# Patient Record
Sex: Male | Born: 1975 | Race: White | Hispanic: No | Marital: Married | Smoking: Never smoker
Health system: Southern US, Community
[De-identification: ages and names within clinical notes are randomized; demographics above are authoritative.]

## PROBLEM LIST (undated history)

## (undated) DIAGNOSIS — I10 Essential (primary) hypertension: Secondary | ICD-10-CM

## (undated) HISTORY — PX: CHOLECYSTECTOMY: SHX55

---

## 2005-02-10 ENCOUNTER — Ambulatory Visit: Payer: Self-pay | Admitting: Otolaryngology

## 2007-11-13 ENCOUNTER — Ambulatory Visit: Payer: Self-pay

## 2007-11-13 ENCOUNTER — Other Ambulatory Visit: Payer: Self-pay

## 2010-05-11 ENCOUNTER — Ambulatory Visit: Payer: Self-pay | Admitting: Family Medicine

## 2010-06-24 ENCOUNTER — Ambulatory Visit: Payer: Self-pay | Admitting: Gastroenterology

## 2013-01-03 LAB — URINALYSIS, COMPLETE
Bacteria: NONE SEEN
Bilirubin,UR: NEGATIVE
Ketone: NEGATIVE
Nitrite: NEGATIVE
Ph: 5 (ref 4.5–8.0)
Protein: 100
RBC,UR: 5 /HPF (ref 0–5)

## 2013-01-03 LAB — CBC
MCHC: 35.2 g/dL (ref 32.0–36.0)
MCV: 90 fL (ref 80–100)
Platelet: 190 10*3/uL (ref 150–440)
RBC: 5.33 10*6/uL (ref 4.40–5.90)
RDW: 13 % (ref 11.5–14.5)
WBC: 13.5 10*3/uL — ABNORMAL HIGH (ref 3.8–10.6)

## 2013-01-03 LAB — COMPREHENSIVE METABOLIC PANEL
Albumin: 4.4 g/dL (ref 3.4–5.0)
Alkaline Phosphatase: 86 U/L (ref 50–136)
BUN: 20 mg/dL — ABNORMAL HIGH (ref 7–18)
Bilirubin,Total: 1.3 mg/dL — ABNORMAL HIGH (ref 0.2–1.0)
Calcium, Total: 9.1 mg/dL (ref 8.5–10.1)
Co2: 24 mmol/L (ref 21–32)
Creatinine: 0.95 mg/dL (ref 0.60–1.30)
EGFR (African American): >60
Glucose: 115 mg/dL — ABNORMAL HIGH (ref 65–99)
Osmolality: 281 (ref 275–301)
SGOT(AST): 41 U/L — ABNORMAL HIGH (ref 15–37)
Sodium: 139 mmol/L (ref 136–145)
Total Protein: 8.6 g/dL — ABNORMAL HIGH (ref 6.4–8.2)

## 2013-01-04 ENCOUNTER — Observation Stay: Payer: Self-pay | Admitting: Internal Medicine

## 2013-01-04 LAB — CLOSTRIDIUM DIFFICILE BY PCR

## 2013-01-05 ENCOUNTER — Ambulatory Visit: Payer: Self-pay | Admitting: Emergency Medicine

## 2013-07-13 ENCOUNTER — Ambulatory Visit: Payer: Self-pay | Admitting: Emergency Medicine

## 2014-01-27 ENCOUNTER — Ambulatory Visit: Payer: Self-pay | Admitting: Physician Assistant

## 2014-06-11 DIAGNOSIS — E538 Deficiency of other specified B group vitamins: Secondary | ICD-10-CM | POA: Insufficient documentation

## 2014-06-11 DIAGNOSIS — K760 Fatty (change of) liver, not elsewhere classified: Secondary | ICD-10-CM | POA: Insufficient documentation

## 2014-06-11 DIAGNOSIS — K219 Gastro-esophageal reflux disease without esophagitis: Secondary | ICD-10-CM | POA: Insufficient documentation

## 2014-06-11 DIAGNOSIS — I10 Essential (primary) hypertension: Secondary | ICD-10-CM | POA: Insufficient documentation

## 2015-03-28 NOTE — Discharge Summary (Signed)
PATIENT NAME:  Alex Jenkins, Alex Jenkins MR#:  [id number] DATE OF BIRTH:  August 03, 1976  DATE OF ADMISSION:  01/04/2013 DATE OF DISCHARGE:  01/04/2013  PRESENTING COMPLAINT: Nausea and diarrhea.   DISCHARGE DIAGNOSES:  1. Acute gastroenteritis, appears viral, resolved.  2. Dehydration clinically improved.   CONDITION ON DISCHARGE: Fair. Vitals stable. Blood pressure 145/84, the pulse is 82. Clostridium difficile negative. EKG normal sinus rhythm. Comprehensive metabolic panel within normal limits except SGOT of 41, BUN of 20 and bilirubin of 1.3. White count of 13.5, hemoglobin and hematocrit 16.9 and 48.1. Urinalysis negative for UTI. Lipase 159.  HOSPITAL COURSE: The patient is a pleasant a 39 year old gentleman with a history of chronic sinusitis and allergic sinusitis who was admitted with:  1. Acute gastroenteritis. The patient has children apparently with diarrhea who recovered; however, he started having crampy, abdominal pain and started having loose, watery stool. He was very lethargic and drowsy when he came. Clinically dehydrated and started on aggressive IV hydration. The patient did not have further diarrheal stools, a C. diff was negative. Do not have fever. The chemistries were apparently normal except elevated BUN. The patient was started on clear liquid diet. He tolerated it and was advanced to soft diet prior to discharge. He did well and requested to go home since he was feeling better. The hospital stay otherwise remained stable.  2. Clinical dehydration from gastroenteritis, improved with IV fluids. The patient euvolemic with stable blood pressure prior to discharge. Hospital stay remained stable.   CODE STATUS: THE PATIENT REMAINED A FULL CODE.   TIME SPENT: 40 minutes.  ____________________________ Hart Rochester Posey Pronto, MD sap:jm D: 01/05/2013 07:05:43 ET T: 01/05/2013 10:58:59 ET JOB#: [id number]  cc: Kimberleigh Mehan A. Posey Pronto, MD, <Dictator> Fonnie Jarvis. Ilene Qua, MD Ilda Basset MD ELECTRONICALLY  SIGNED 01/12/2013 7:05

## 2015-03-28 NOTE — H&P (Signed)
PATIENT NAME:  Alex Jenkins, Alex Jenkins MR#:  [id number] DATE OF BIRTH:  01-28-1976  DATE OF ADMISSION:  01/04/2013  PRIMARY CARE PHYSICIAN:  Dr. Gaylan Gerold in [facility] in [place].   REFERRING PHYSICIAN:  Dr. Ponciano Ort.   CHIEF COMPLAINT:  Severe diarrhea associated with generalized weakness, sweating.   HISTORY OF PRESENT ILLNESS:  The patient is a 39 year old pleasant Caucasian male with a healthy past medical history apart from a history of chronic sinusitis and allergic rhinitis and history of gastritis.  The patient reports that his children had self-limited gastroenteritis, primarily diarrhea, but usually lasts for several hours and then they recover.  He was doing well until yesterday morning.  When he woke up he had an uneasy feeling in his stomach, then he started to have little bit of cramps, crampy abdominal pain and then started to have diarrhea.  He had total episodes of 10 watery bowel movements without melena and no hematochezia.  He had no fever, but he felt hot and cold, along with that he had some chills.  When symptoms worsened he decided to come to the Emergency Department.  At the time of arrival he has generalized weakness, feels very weak, barely able to even open his eyes and he was sweating. Here at emergency department was treated with intravenous fluids.  He received a total of 3 liters, but he continued to have back and forth diarrhea.  He had another 5 to 6 episodes where he is at the Emergency Department.  Finally, I was asked by the Emergency Department physician to admit the patient for 24 hours with IV hydration.  The patient had mild nausea, if any.  No vomiting.   REVIEW OF SYSTEMS:  CONSTITUTIONAL:  Denies any fever, but has some chills and sweating and fatigue.  EYES:  No blurring of vision.  No double vision.  EARS, NOSE, THROAT:  No hearing impairment.  No sore throat.  No dysphagia.  CARDIOVASCULAR:  No chest pain.  No shortness of breath.  No syncope.   RESPIRATORY:  No shortness of breath.  No cough.  No chest pain.  GASTROINTESTINAL:  Mild cramps in the abdomen.  No vomiting, but he has frequent diarrhea bowel movements.  No melena or hematochezia.  GENITOURINARY:  No dysuria.  No frequency of urination.  MUSCULOSKELETAL:  No joint pain or swelling.  No muscular pain or swelling.  INTEGUMENTARY:  No skin rash.  No ulcers.  NEUROLOGY:  No focal weakness.  No seizure activity.  No headache.  PSYCHIATRY:  No anxiety.  No depression.  ENDOCRINE:  No polyuria or polydipsia.  No heat or cold intolerance.  HEMATOLOGY:  No easy bruisability.  No lymph node enlargement.   PAST MEDICAL HISTORY:  Gastritis.  He had upper endoscopy in 2011 showing gastritis and he is maintained on Protonix.  Allergic rhinitis and history of chronic sinusitis.   PAST SURGICAL HISTORY:  Sinus surgery x 2.  History of cholecystectomy at age of 35 secondary to gallbladder stones.   FAMILY HISTORY:  His father has hypertension.  His brother also has hypertension.  His mother is alive and she is healthy.  She has colon polyps.   SOCIAL HABITS:  Nonsmoker.  No history of alcohol or drug abuse.   SOCIAL HISTORY:  He is married, living with his wife and he works at [employer]:  Protonix 40 mg a day and he takes allergy shots weekly.  These are targeting grass and pollen  antigens.   ALLERGIES:  No known drug allergies.   PHYSICAL EXAMINATION: VITAL SIGNS:  Blood pressure 150/75, respiratory rate 20, pulse 103, temperature 99, oxygen saturation 98%.  GENERAL APPEARANCE:  A young male lying in bed.  He looks exhausted.  HEAD AND NECK:  No pallor.  No icterus.  No cyanosis.  EARS, NOSE, THROAT:  Ear examination revealed normal hearing.  No discharge.  No lesions.  Nasal mucosa was normal, no ulcers, no discharge.  Oropharyngeal area was normal.  No oral thrush.  The tongue was coated with brownish material.  It appears that he took a sip of coffee  earlier.  No exudates.  EYES:  Revealed normal eyelids and conjunctivae.  Pupils about 5 mm, equal, round and reactive to light.  NECK:  Is supple.  Trachea at midline.  No thyromegaly.  No cervical lymphadenopathy.  No masses.  HEART:  Revealed normal S1, S2.  No S3, S4.  No murmur.  No gallop.  No carotid bruits.  RESPIRATORY:  Revealed normal breathing pattern without use of accessory muscles.  No rales.  No wheezing.  ABDOMEN:  Soft without tenderness.  No hepatosplenomegaly.  No masses.  No hernias.  SKIN:  Revealed no ulcers.  No subcutaneous nodules.  MUSCULOSKELETAL:  No joint swelling.  No clubbing.  NEUROLOGIC:  Cranial nerves II-XII are intact.  No focal motor deficit.  PSYCHIATRIC:  The patient is alert and oriented x 3.  Mood and affect were normal.   LABORATORY FINDINGS:  EKG showed normal sinus rhythm at rate of 90 per minute.  Incomplete right bundle branch block.  Nonspecific T-wave inversion in lead III, otherwise unremarkable EKG.  Serum glucose 115, BUN 20, creatinine 0.9, sodium 139, potassium 4.5, total protein 8.6, albumin 4.4, bilirubin 1.3, alkaline phosphatase 86, AST 41, ALT 52.  CBC showed white count of 13,000, hemoglobin 16, hematocrit 48, platelet count 190.  Urinalysis was unremarkable.   ASSESSMENT: 1.  Acute gastroenteritis.  2.  Dehydration.  3.  Generalized weakness.   PLAN:  We will admit the patient for observation.  Continue IV hydration.  Lomotil 1 mg q. 6 hours as needed for diarrhea.   TIME SPENT IN EVALUATING THIS PATIENT:  Took more than 40 minutes.     ____________________________ Clovis Pu. Lenore Manner, MD amd:ea D: 01/04/2013 01:49:37 ET T: 01/04/2013 03:36:30 ET JOB#: [id number]  cc: Clovis Pu. Lenore Manner, MD, <Dictator> Ellin Saba MD ELECTRONICALLY SIGNED 01/12/2013 6:35

## 2015-05-26 ENCOUNTER — Other Ambulatory Visit: Payer: Self-pay | Admitting: Family Medicine

## 2015-05-26 DIAGNOSIS — R1011 Right upper quadrant pain: Secondary | ICD-10-CM

## 2015-05-27 ENCOUNTER — Ambulatory Visit
Admission: RE | Admit: 2015-05-27 | Discharge: 2015-05-27 | Disposition: A | Discharge status: Home / Self Care | Payer: BLUE CROSS/BLUE SHIELD | Source: Ambulatory Visit | Attending: Family Medicine | Admitting: Family Medicine

## 2015-05-27 DIAGNOSIS — R1011 Right upper quadrant pain: Secondary | ICD-10-CM | POA: Diagnosis not present

## 2017-07-14 ENCOUNTER — Encounter: Payer: Self-pay | Admitting: Emergency Medicine

## 2017-07-14 ENCOUNTER — Emergency Department: Payer: BLUE CROSS/BLUE SHIELD

## 2017-07-14 ENCOUNTER — Emergency Department
Admission: EM | Admit: 2017-07-14 | Discharge: 2017-07-14 | Disposition: A | Discharge status: Home / Self Care | Payer: BLUE CROSS/BLUE SHIELD | Attending: Student in an Organized Health Care Education/Training Program | Admitting: Student in an Organized Health Care Education/Training Program

## 2017-07-14 DIAGNOSIS — M25531 Pain in right wrist: Secondary | ICD-10-CM | POA: Diagnosis present

## 2017-07-14 LAB — BASIC METABOLIC PANEL
ANION GAP: 6 (ref 5–15)
BUN: 15 mg/dL (ref 6–20)
CALCIUM: 9.9 mg/dL (ref 8.9–10.3)
CO2: 26 mmol/L (ref 22–32)
CREATININE: 0.83 mg/dL (ref 0.61–1.24)
Chloride: 107 mmol/L (ref 101–111)
GFR calc Af Amer: >60 mL/min (ref >60)
GFR calc non Af Amer: >60 mL/min (ref >60)
GLUCOSE: 103 mg/dL — AB (ref 65–99)
Potassium: 4 mmol/L (ref 3.5–5.1)
Sodium: 139 mmol/L (ref 135–145)

## 2017-07-14 LAB — CBC WITH DIFFERENTIAL/PLATELET
BASOS ABS: 0.1 10*3/uL (ref 0–0.1)
BASOS PCT: 1 %
EOS PCT: 1 %
Eosinophils Absolute: 0.1 10*3/uL (ref 0–0.7)
HCT: 40.4 % (ref 40.0–52.0)
Hemoglobin: 14.5 g/dL (ref 13.0–18.0)
Lymphocytes Relative: 20 %
Lymphs Abs: 2.2 10*3/uL (ref 1.0–3.6)
MCH: 32.2 pg (ref 26.0–34.0)
MCHC: 35.8 g/dL (ref 32.0–36.0)
MCV: 89.9 fL (ref 80.0–100.0)
Monocytes Absolute: 0.7 10*3/uL (ref 0.2–1.0)
Monocytes Relative: 6 %
Neutro Abs: 8.2 10*3/uL — ABNORMAL HIGH (ref 1.4–6.5)
Neutrophils Relative %: 72 %
PLATELETS: 207 10*3/uL (ref 150–440)
RBC: 4.49 MIL/uL (ref 4.40–5.90)
RDW: 13.2 % (ref 11.5–14.5)
WBC: 11.2 10*3/uL — AB (ref 3.8–10.6)

## 2017-07-14 MED ORDER — KETOROLAC TROMETHAMINE 30 MG/ML IJ SOLN
15.0000 mg | Freq: Once | INTRAMUSCULAR | Status: AC
  Administered 2017-07-14: 15 mg via INTRAVENOUS

## 2017-07-14 MED ORDER — MORPHINE SULFATE (PF) 4 MG/ML IV SOLN
4.0000 mg | INTRAVENOUS | Status: DC | PRN
  Filled 2017-07-14: qty 1

## 2017-07-14 MED ORDER — OXYCODONE-ACETAMINOPHEN 5-325 MG PO TABS
1.0000 | ORAL_TABLET | Freq: Once | ORAL | Status: AC
  Administered 2017-07-14: 1 via ORAL

## 2017-07-14 MED ORDER — MORPHINE SULFATE (PF) 4 MG/ML IV SOLN: 4.0000 mg | INTRAVENOUS | Status: DC | PRN

## 2017-07-14 MED ORDER — LIDOCAINE 5 % EX PTCH
1.0000 | MEDICATED_PATCH | CUTANEOUS | Status: DC
  Administered 2017-07-14: 1 via TRANSDERMAL
  Filled 2017-07-14: qty 1

## 2017-07-14 MED ORDER — OXYCODONE-ACETAMINOPHEN 5-325 MG PO TABS
ORAL_TABLET | ORAL | Status: AC
  Filled 2017-07-14: qty 1

## 2017-07-14 MED ORDER — BUPIVACAINE HCL (PF) 0.5 % IJ SOLN
INTRAMUSCULAR | Status: AC
  Filled 2017-07-14: qty 30

## 2017-07-14 MED ORDER — NAPROXEN 500 MG PO TABS: 500.0000 mg | ORAL_TABLET | Freq: Two times a day (BID) | ORAL | 0 refills | Status: AC

## 2017-07-14 MED ORDER — KETOROLAC TROMETHAMINE 30 MG/ML IJ SOLN
INTRAMUSCULAR | Status: AC
  Administered 2017-07-14: 15 mg via INTRAVENOUS
  Filled 2017-07-14: qty 1

## 2017-07-14 MED ORDER — HYDROCODONE-ACETAMINOPHEN 5-325 MG PO TABS: 1.0000 | ORAL_TABLET | ORAL | 0 refills | Status: DC | PRN

## 2017-07-14 NOTE — ED Triage Notes (Signed)
Pt ambulatory to triage with steady gait, no distress noted. Pt c/o right wrist and forearm pain and swelling, denies injury to area. 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

## 2017-07-14 NOTE — Discharge Instructions (Signed)
Keep arm elevated above heart.  Return immediately for fevers, worsening pain, swelling or inability to move wrist.  Follow up with Ortho clinic.

## 2017-07-14 NOTE — ED Provider Notes (Signed)
[facility] Emergency Department Provider Note    First MD Initiated Contact with Patient 07/14/17 0222     (approximate)  I have reviewed the triage vital signs and the nursing notes.   HISTORY  Chief Complaint Arm Injury    HPI Alex Jenkins is a 41 y.o. male presents to complain of right hand pain and swelling that started tonight. 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 Started having some achiness in his hand while playing piano.  He went home and has persistent worsening in pain No fevers. States he was feeling that his hand was tightening up. He points to the dorsal aspect of his right wrist as the point of most pain. No history of gout.No trauma that he is aware of. No history of arthritis.   History reviewed. No pertinent past medical history. History reviewed. No pertinent family history. History reviewed. No pertinent surgical history. There are no active problems to display for this patient.     Prior to Admission medications   Medication Sig Start Date End Date Taking? Authorizing Provider  HYDROcodone-acetaminophen (NORCO) 5-325 MG tablet Take 1 tablet by mouth every 4 (four) hours as needed for moderate pain. 07/14/17   Merlyn Lot, MD  naproxen (NAPROSYN) 500 MG tablet Take 1 tablet (500 mg total) by mouth 2 (two) times daily with a meal. 07/14/17 07/14/18  Merlyn Lot, MD    Allergies Patient has no known allergies.    Social History Social History  Substance Use Topics  . Smoking status: Never Smoker  . Smokeless tobacco: Never Used  . Alcohol use No    Review of Systems Patient denies headaches, rhinorrhea, blurry vision, numbness, shortness of breath, chest pain, edema, cough, abdominal pain, nausea, vomiting, diarrhea, dysuria, fevers, rashes or hallucinations unless otherwise stated above in HPI. ____________________________________________   PHYSICAL  EXAM:  VITAL SIGNS: Vitals:   07/14/17 0015 07/14/17 0345  BP: (!) 151/89 (!) 145/84  Pulse: 76   Resp: 17 18  Temp: 98 F (36.7 C)     Constitutional: Alert and oriented. Well appearing and in no acute distress. Eyes: Conjunctivae are normal.  Head: Atraumatic. Nose: No congestion/rhinnorhea. Mouth/Throat: Mucous membranes are moist.   Neck: No stridor. Painless ROM.  Cardiovascular: Normal rate, regular rhythm. Grossly normal heart sounds.  Good peripheral circulation. Respiratory: Normal respiratory effort.  No retractions. Lungs CTAB. Gastrointestinal: Soft and nontender. No distention. No abdominal bruits. No CVA tenderness. Genitourinary:  Musculoskeletal: No lower extremity tenderness nor edema.  No joint effusions.  Swelling and point ttp over extonsor component of right radial wrist, no efffusion, erythema or warmth.  Pain with hyperflexion,  Painless passive anf active ROM of wrist. Neurologic:  Normal speech and language. No gross focal neurologic deficits are appreciated. No facial droop Skin:  Skin is warm, dry and intact. No rash noted. Psychiatric: Mood and affect are normal. Speech and behavior are normal.  ____________________________________________   LABS (all labs ordered are listed, but only abnormal results are displayed)  Results for orders placed or performed during the hospital encounter of 07/14/17 (from the past 24 hour(s))  CBC with Differential/Platelet     Status: Abnormal   Collection Time: 07/14/17  2:49 AM  Result Value Ref Range   WBC 11.2 (H) 3.8 - 10.6 K/uL   RBC 4.49 4.40 - 5.90 MIL/uL   Hemoglobin 14.5 13.0 - 18.0 g/dL   HCT 40.4 40.0 - 52.0 %  MCV 89.9 80.0 - 100.0 fL   MCH 32.2 26.0 - 34.0 pg   MCHC 35.8 32.0 - 36.0 g/dL   RDW 13.2 11.5 - 14.5 %   Platelets 207 150 - 440 K/uL   Neutrophils Relative % 72 %   Neutro Abs 8.2 (H) 1.4 - 6.5 K/uL   Lymphocytes Relative 20 %   Lymphs Abs 2.2 1.0 - 3.6 K/uL   Monocytes Relative 6 %    Monocytes Absolute 0.7 0.2 - 1.0 K/uL   Eosinophils Relative 1 %   Eosinophils Absolute 0.1 0 - 0.7 K/uL   Basophils Relative 1 %   Basophils Absolute 0.1 0 - 0.1 K/uL  Basic metabolic panel     Status: Abnormal   Collection Time: 07/14/17  2:49 AM  Result Value Ref Range   Sodium 139 135 - 145 mmol/L   Potassium 4.0 3.5 - 5.1 mmol/L   Chloride 107 101 - 111 mmol/L   CO2 26 22 - 32 mmol/L   Glucose, Bld 103 (H) 65 - 99 mg/dL   BUN 15 6 - 20 mg/dL   Creatinine, Ser 0.83 0.61 - 1.24 mg/dL   Calcium 9.9 8.9 - 10.3 mg/dL   GFR calc non Af Amer >60 >60 mL/min   GFR calc Af Amer >60 >60 mL/min   Anion gap 6 5 - 15   ____________________________________________ ____________________________________________  RADIOLOGY  I personally reviewed all radiographic images ordered to evaluate for the above acute complaints and reviewed radiology reports and findings.  These findings were personally discussed with the patient.  Please see medical record for radiology report.  ____________________________________________   PROCEDURES  Procedure(s) performed:  Procedures Procedure: Trigger point injection. The patient agreed to the procedure without reservation, and acknowledging the risks of infection, nerve damage, damage to internal organs, lack of efficacy, bleeding. The  was prepped with chloraprep and allowed to dry. A 10 cc syringe was loaded with 0.5% Bupivacaine and a 27 ga needle advanced toward the areas of discomfort The plunger was withdrawn before injection. The patient tolerated the procedure well.     Critical Care performed: no ____________________________________________   INITIAL IMPRESSION / ASSESSMENT AND PLAN / ED COURSE  Pertinent labs & imaging results that were available during my care of the patient were reviewed by me and considered in my medical decision making (see chart for details).  DDX: tenosynovitis, arthritis, fracture, tendonitis, cellulitis, nec  fasc  Alex Jenkins is a 41 y.o. who presents to the ED with acute right wrist pain as described above. No evidence of fracture. No evidence of subcutaneous air or gas. No evidence of cellulitis, abscess.  he is not an IV drug abuser. Patient is able to range his wrist therefore do not feel this consistent with septic arthritis. Pain seems to be primarily located along the extensor tendon sheath. There is no significant edema to suggest DVT.  After discussing options for analgesia the patient elected for a trial of local anesthesia. Marcaine was injected as described above with significant relief of his symptom Patient placed in a wrist splint. Discussed signs and symptoms for which she should return immediately to the hospital. Patient given referral for orthopedic follow-up.  Have discussed with the patient and available family all diagnostics and treatments performed thus far and all questions were answered to the best of my ability. The patient demonstrates understanding and agreement with plan.       ____________________________________________   FINAL CLINICAL IMPRESSION(S) / ED DIAGNOSES  Final diagnoses:  Right wrist pain      NEW MEDICATIONS STARTED DURING THIS VISIT:  Discharge Medication List as of 07/14/2017  3:39 AM    START taking these medications   Details  HYDROcodone-acetaminophen (NORCO) 5-325 MG tablet Take 1 tablet by mouth every 4 (four) hours as needed for moderate pain., Starting Thu 07/14/2017, Print    naproxen (NAPROSYN) 500 MG tablet Take 1 tablet (500 mg total) by mouth 2 (two) times daily with a meal., Starting Thu 07/14/2017, Until Fri 07/14/2018, Print         Note:  This document was prepared using Dragon voice recognition software and may include unintentional dictation errors.    Merlyn Lot, MD 07/14/17 [phone]

## 2018-02-23 ENCOUNTER — Other Ambulatory Visit: Payer: Self-pay | Admitting: Gastroenterology

## 2018-02-23 DIAGNOSIS — R1011 Right upper quadrant pain: Secondary | ICD-10-CM

## 2018-02-28 ENCOUNTER — Ambulatory Visit
Admission: RE | Admit: 2018-02-28 | Discharge: 2018-02-28 | Disposition: A | Discharge status: Home / Self Care | Payer: BLUE CROSS/BLUE SHIELD | Source: Ambulatory Visit | Attending: Gastroenterology | Admitting: Gastroenterology

## 2018-02-28 DIAGNOSIS — Z9049 Acquired absence of other specified parts of digestive tract: Secondary | ICD-10-CM | POA: Diagnosis not present

## 2018-02-28 DIAGNOSIS — R1011 Right upper quadrant pain: Secondary | ICD-10-CM | POA: Insufficient documentation

## 2018-02-28 DIAGNOSIS — K769 Liver disease, unspecified: Secondary | ICD-10-CM | POA: Diagnosis not present

## 2018-05-25 DIAGNOSIS — F418 Other specified anxiety disorders: Secondary | ICD-10-CM | POA: Insufficient documentation

## 2018-05-25 DIAGNOSIS — E66813 Obesity, class 3: Secondary | ICD-10-CM | POA: Insufficient documentation

## 2018-07-11 ENCOUNTER — Other Ambulatory Visit: Payer: Self-pay | Admitting: Family Medicine

## 2018-07-11 DIAGNOSIS — R1084 Generalized abdominal pain: Secondary | ICD-10-CM

## 2018-07-11 DIAGNOSIS — R1011 Right upper quadrant pain: Secondary | ICD-10-CM

## 2018-07-17 ENCOUNTER — Ambulatory Visit
Admission: RE | Admit: 2018-07-17 | Discharge: 2018-07-17 | Disposition: A | Discharge status: Home / Self Care | Payer: BLUE CROSS/BLUE SHIELD | Source: Ambulatory Visit | Attending: Family Medicine | Admitting: Family Medicine

## 2018-07-17 DIAGNOSIS — K449 Diaphragmatic hernia without obstruction or gangrene: Secondary | ICD-10-CM | POA: Insufficient documentation

## 2018-07-17 DIAGNOSIS — R1084 Generalized abdominal pain: Secondary | ICD-10-CM | POA: Insufficient documentation

## 2018-07-17 DIAGNOSIS — Z9049 Acquired absence of other specified parts of digestive tract: Secondary | ICD-10-CM | POA: Diagnosis not present

## 2018-07-17 DIAGNOSIS — D3502 Benign neoplasm of left adrenal gland: Secondary | ICD-10-CM | POA: Insufficient documentation

## 2018-07-17 DIAGNOSIS — R1011 Right upper quadrant pain: Secondary | ICD-10-CM

## 2018-07-17 HISTORY — DX: Essential (primary) hypertension: I10

## 2018-07-17 MED ORDER — IOPAMIDOL (ISOVUE-300) INJECTION 61%
100.0000 mL | Freq: Once | INTRAVENOUS | Status: AC | PRN
  Administered 2018-07-17: 100 mL via INTRAVENOUS

## 2018-08-31 ENCOUNTER — Other Ambulatory Visit: Payer: Self-pay | Admitting: Student

## 2018-08-31 DIAGNOSIS — M5414 Radiculopathy, thoracic region: Secondary | ICD-10-CM

## 2018-08-31 DIAGNOSIS — M5412 Radiculopathy, cervical region: Secondary | ICD-10-CM

## 2018-09-07 ENCOUNTER — Ambulatory Visit
Admission: RE | Admit: 2018-09-07 | Discharge: 2018-09-07 | Disposition: A | Discharge status: Home / Self Care | Payer: BLUE CROSS/BLUE SHIELD | Source: Ambulatory Visit | Attending: Student | Admitting: Student

## 2018-09-07 DIAGNOSIS — M5412 Radiculopathy, cervical region: Secondary | ICD-10-CM

## 2018-09-07 DIAGNOSIS — M5414 Radiculopathy, thoracic region: Secondary | ICD-10-CM

## 2018-09-13 ENCOUNTER — Ambulatory Visit
Admission: RE | Admit: 2018-09-13 | Discharge: 2018-09-13 | Disposition: A | Discharge status: Home / Self Care | Payer: BLUE CROSS/BLUE SHIELD | Source: Ambulatory Visit | Attending: Student in an Organized Health Care Education/Training Program | Admitting: Student in an Organized Health Care Education/Training Program

## 2018-09-13 ENCOUNTER — Ambulatory Visit (HOSPITAL_BASED_OUTPATIENT_CLINIC_OR_DEPARTMENT_OTHER): Payer: BLUE CROSS/BLUE SHIELD | Admitting: Student in an Organized Health Care Education/Training Program

## 2018-09-13 ENCOUNTER — Other Ambulatory Visit: Payer: Self-pay

## 2018-09-13 ENCOUNTER — Encounter: Payer: Self-pay | Admitting: Student in an Organized Health Care Education/Training Program

## 2018-09-13 VITALS — BP 137/96 | HR 70 | Temp 98.3°F | Resp 16 | Ht 74.5 in | Wt 322.0 lb

## 2018-09-13 DIAGNOSIS — I10 Essential (primary) hypertension: Secondary | ICD-10-CM | POA: Diagnosis not present

## 2018-09-13 DIAGNOSIS — G8929 Other chronic pain: Secondary | ICD-10-CM | POA: Insufficient documentation

## 2018-09-13 DIAGNOSIS — M5414 Radiculopathy, thoracic region: Secondary | ICD-10-CM | POA: Insufficient documentation

## 2018-09-13 DIAGNOSIS — M5412 Radiculopathy, cervical region: Secondary | ICD-10-CM | POA: Insufficient documentation

## 2018-09-13 DIAGNOSIS — M546 Pain in thoracic spine: Secondary | ICD-10-CM | POA: Diagnosis present

## 2018-09-13 DIAGNOSIS — M4802 Spinal stenosis, cervical region: Secondary | ICD-10-CM | POA: Insufficient documentation

## 2018-09-13 DIAGNOSIS — Z79899 Other long term (current) drug therapy: Secondary | ICD-10-CM | POA: Diagnosis not present

## 2018-09-13 DIAGNOSIS — M5114 Intervertebral disc disorders with radiculopathy, thoracic region: Secondary | ICD-10-CM | POA: Diagnosis not present

## 2018-09-13 DIAGNOSIS — M4804 Spinal stenosis, thoracic region: Secondary | ICD-10-CM | POA: Insufficient documentation

## 2018-09-13 DIAGNOSIS — M79641 Pain in right hand: Secondary | ICD-10-CM | POA: Insufficient documentation

## 2018-09-13 DIAGNOSIS — M7989 Other specified soft tissue disorders: Secondary | ICD-10-CM | POA: Insufficient documentation

## 2018-09-13 DIAGNOSIS — M542 Cervicalgia: Secondary | ICD-10-CM | POA: Insufficient documentation

## 2018-09-13 DIAGNOSIS — R109 Unspecified abdominal pain: Secondary | ICD-10-CM | POA: Diagnosis present

## 2018-09-13 DIAGNOSIS — R0781 Pleurodynia: Secondary | ICD-10-CM | POA: Diagnosis not present

## 2018-09-13 MED ORDER — LIDOCAINE HCL 2 % IJ SOLN
INTRAMUSCULAR | Status: AC
  Filled 2018-09-13: qty 20

## 2018-09-13 MED ORDER — SODIUM CHLORIDE 0.9% FLUSH
2.0000 mL | Freq: Once | INTRAVENOUS | Status: AC
  Administered 2018-09-13: 10 mL

## 2018-09-13 MED ORDER — SODIUM CHLORIDE 0.9 % IJ SOLN
INTRAMUSCULAR | Status: AC
  Filled 2018-09-13: qty 10

## 2018-09-13 MED ORDER — IOPAMIDOL (ISOVUE-M 200) INJECTION 41%
10.0000 mL | Freq: Once | INTRAMUSCULAR | Status: AC
  Administered 2018-09-13: 10 mL via EPIDURAL

## 2018-09-13 MED ORDER — IOPAMIDOL (ISOVUE-M 200) INJECTION 41%
INTRAMUSCULAR | Status: AC
  Filled 2018-09-13: qty 10

## 2018-09-13 MED ORDER — ROPIVACAINE HCL 2 MG/ML IJ SOLN
INTRAMUSCULAR | Status: AC
  Filled 2018-09-13: qty 10

## 2018-09-13 MED ORDER — LIDOCAINE HCL 2 % IJ SOLN
10.0000 mL | Freq: Once | INTRAMUSCULAR | Status: AC
  Administered 2018-09-13: 400 mg

## 2018-09-13 MED ORDER — ROPIVACAINE HCL 2 MG/ML IJ SOLN
2.0000 mL | Freq: Once | INTRAMUSCULAR | Status: AC
  Administered 2018-09-13: 10 mL via EPIDURAL

## 2018-09-13 MED ORDER — DEXAMETHASONE SODIUM PHOSPHATE 10 MG/ML IJ SOLN
INTRAMUSCULAR | Status: AC
  Filled 2018-09-13: qty 1

## 2018-09-13 MED ORDER — DEXAMETHASONE SODIUM PHOSPHATE 10 MG/ML IJ SOLN
10.0000 mg | Freq: Once | INTRAMUSCULAR | Status: AC
  Administered 2018-09-13: 10 mg

## 2018-09-13 NOTE — Progress Notes (Signed)
Safety precautions to be maintained throughout the outpatient stay will include: orient to surroundings, keep bed in low position, maintain call bell within reach at all times, provide assistance with transfer out of bed and ambulation.  

## 2018-09-13 NOTE — Progress Notes (Signed)
Patient's Name: Alex Jenkins  MRN: [id number]  Referring Provider: Harvest Dark, FNP  DOB: Jun 24, 1976  PCP: Derinda Late, MD  DOS: 09/13/2018  Note by: Gillis Santa, MD  Service setting: Ambulatory outpatient  Specialty: Interventional Pain Management  Patient type: Established  Location: [facility] (AMB) Pain Management Facility  Visit type: Interventional Procedure   Primary Reason for Visit: Interventional Pain Management Treatment. CC: Back Pain (mid); Abdominal Pain (right); and Neck Pain  Procedure:          Anesthesia, Analgesia, Anxiolysis:  Type: Therapeutic Inter-Laminar Thoracic Epidural Block  #1  Region: Posterior Thoracolumbar Level: T12-L1 Laterality: Right-Sided       Type: Local Anesthesia Indication(s): Analgesia         Route: Infiltration ([place]/IM) IV Access: Declined Sedation: Declined  Local Anesthetic: Lidocaine 1-2%  Position: Prone   Indications: 1. Thoracic radiculopathy (R-T11/T12)   2. Thoracic spinal stenosis (T11/T12)   3. Cervical radiculopathy at C6 (LEFT)   4. Foraminal stenosis of cervical region    Pain Score: Pre-procedure: 1 /10 Post-procedure: 1 /10  Pre-op Assessment:  Alex Jenkins is a 42 y.o. (year old), male patient, seen today for interventional treatment. He  has a past surgical history that includes Cholecystectomy. Alex Jenkins has a current medication list which includes the following prescription(s): fluticasone, krill oil, multivitamin, pantoprazole, prednisone, valsartan, vitamin b-12, and hydrocodone-acetaminophen. His primarily concern today is the Back Pain (mid); Abdominal Pain (right); and Neck Pain  Initial Vital Signs:  Pulse/HCG Rate: 70ECG Heart Rate: 68 Temp: 98.3 F (36.8 C) Resp: 16 BP: (!) 141/92 SpO2: 100 %  BMI: Estimated body mass index is 40.79 kg/m as calculated from the following:   Height as of this encounter: 6' 2.5" (1.892 m).   Weight as of this encounter: 322 lb (146.1 kg).  Risk  Assessment: Allergies: Reviewed. He has No Known Allergies.  Allergy Precautions: None required Coagulopathies: Reviewed. None identified.  Blood-thinner therapy: None at this time Active Infection(s): Reviewed. None identified. Alex Jenkins is afebrile  Site Confirmation: Alex Jenkins was asked to confirm the procedure and laterality before marking the site Procedure checklist: Completed Consent: Before the procedure and under the influence of no sedative(s), amnesic(s), or anxiolytics, the patient was informed of the treatment options, risks and possible complications. To fulfill our ethical and legal obligations, as recommended by the American Medical Association's Code of Ethics, I have informed the patient of my clinical impression; the nature and purpose of the treatment or procedure; the risks, benefits, and possible complications of the intervention; the alternatives, including doing nothing; the risk(s) and benefit(s) of the alternative treatment(s) or procedure(s); and the risk(s) and benefit(s) of doing nothing. The patient was provided information about the general risks and possible complications associated with the procedure. These may include, but are not limited to: failure to achieve desired goals, infection, bleeding, organ or nerve damage, allergic reactions, paralysis, and death. In addition, the patient was informed of those risks and complications associated to Spine-related procedures, such as failure to decrease pain; infection (i.e.: Meningitis, epidural or intraspinal abscess); bleeding (i.e.: epidural hematoma, subarachnoid hemorrhage, or any other type of intraspinal or peri-dural bleeding); organ or nerve damage (i.e.: Any type of peripheral nerve, nerve root, or spinal cord injury) with subsequent damage to sensory, motor, and/or autonomic systems, resulting in permanent pain, numbness, and/or weakness of one or several areas of the body; allergic reactions; (i.e.: anaphylactic  reaction); and/or death. Furthermore, the patient was informed of those risks  and complications associated with the medications. These include, but are not limited to: allergic reactions (i.e.: anaphylactic or anaphylactoid reaction(s)); adrenal axis suppression; blood sugar elevation that in diabetics may result in ketoacidosis or comma; water retention that in patients with history of congestive heart failure may result in shortness of breath, pulmonary edema, and decompensation with resultant heart failure; weight gain; swelling or edema; medication-induced neural toxicity; particulate matter embolism and blood vessel occlusion with resultant organ, and/or nervous system infarction; and/or aseptic necrosis of one or more joints. Finally, the patient was informed that Medicine is not an exact science; therefore, there is also the possibility of unforeseen or unpredictable risks and/or possible complications that may result in a catastrophic outcome. The patient indicated having understood very clearly. We have given the patient no guarantees and we have made no promises. Enough time was given to the patient to ask questions, all of which were answered to the patient's satisfaction. Alex Jenkins has indicated that he wanted to continue with the procedure. Attestation: I, the ordering provider, attest that I have discussed with the patient the benefits, risks, side-effects, alternatives, likelihood of achieving goals, and potential problems during recovery for the procedure that I have provided informed consent. Date  Time: 09/13/2018  8:35 AM  Pre-Procedure Preparation:  Monitoring: As per clinic protocol. Respiration, ETCO2, SpO2, BP, heart rate and rhythm monitor placed and checked for adequate function Safety Precautions: Patient was assessed for positional comfort and pressure points before starting the procedure. Time-out: I initiated and conducted the "Time-out" before starting the procedure, as per  protocol. The patient was asked to participate by confirming the accuracy of the "Time Out" information. Verification of the correct person, site, and procedure were performed and confirmed by me, the nursing staff, and the patient. "Time-out" conducted as per Joint Commission's Universal Protocol (UP.01.01.01). Time: 0925  Description of Procedure:          Target Area: For Epidural Steroid injection(s), the target area is the  interlaminar space, initially targeting the lower border of the superior vertebral body lamina. Approach: Interlaminar approach. Area Prepped: Entire Posterior Thoracolumbar Region Prepping solution: ChloraPrep (2% chlorhexidine gluconate and 70% isopropyl alcohol) Safety Precautions: Aspiration looking for blood return was conducted prior to all injections. At no point did we inject any substances, as a needle was being advanced. No attempts were made at seeking any paresthesias. Safe injection practices and needle disposal techniques used. Medications properly checked for expiration dates. SDV (single dose vial) medications used. Description of the Procedure: Protocol guidelines were followed. The patient was placed in position over the fluoroscopy table. The target area was identified and the area prepped in the usual manner. Skin & deeper tissues infiltrated with local anesthetic. Appropriate amount of time allowed to pass for local anesthetics to take effect. The procedure needles were then advanced to the target area. The inferior aspect of the superior lamina was contacted and the needle walked caudad, until the lamina was cleared. The epidural space was identified using "loss-of-resistance technique" with 0.9% PF-NSS (2-28mL), in a low friction 10cc LOR glass syringe. Proper needle placement was secured. Negative aspiration confirmed. Solution injected in intermittent fashion, asking for systemic symptoms every 0.5 cc of injectate. The needles were then removed and the area  cleansed, making sure to leave some of the prepping solution behind to take advantage of its long term bactericidal properties. Vitals:   09/13/18 0928 09/13/18 0933 09/13/18 0938 09/13/18 0947  BP: 136/90 131/78 135/81 (!) 137/96  Pulse:      Resp: 14 19 15 16   Temp:      TempSrc:      SpO2: 100% 100% 100% 100%  Weight:      Height:        Start Time: 0925 hrs. End Time: 0941 hrs. Materials:  Needle(s) Type: Epidural needle Gauge: 17G Length: 3.5-in Medication(s): Please see orders for medications and dosing details. 5 cc solution consisting of 2 cc of 0.2% ropivacaine, 2 cc of preservative-free saline, 1 cc of Decadron 10 mg/cc. Imaging Guidance (Spinal):          Type of Imaging Technique: Fluoroscopy Guidance (Spinal) Indication(s): Assistance in needle guidance and placement for procedures requiring needle placement in or near specific anatomical locations not easily accessible without such assistance. Exposure Time: Please see nurses notes. Contrast: Before injecting any contrast, we confirmed that the patient did not have an allergy to iodine, shellfish, or radiological contrast. Once satisfactory needle placement was completed at the desired level, radiological contrast was injected. Contrast injected under live fluoroscopy. No contrast complications. See chart for type and volume of contrast used. Fluoroscopic Guidance: I was personally present during the use of fluoroscopy. "Tunnel Vision Technique" used to obtain the best possible view of the target area. Parallax error corrected before commencing the procedure. "Direction-depth-direction" technique used to introduce the needle under continuous pulsed fluoroscopy. Once target was reached, antero-posterior, oblique, and lateral fluoroscopic projection used confirm needle placement in all planes. Images permanently stored in EMR. Interpretation: I personally interpreted the imaging intraoperatively. Adequate needle placement  confirmed in multiple planes. Appropriate spread of contrast into desired area was observed. No evidence of afferent or efferent intravascular uptake. No intrathecal or subarachnoid spread observed. Permanent images saved into the patient's record.  Antibiotic Prophylaxis:   Anti-infectives (From admission, onward)   None     Indication(s): None identified  Post-operative Assessment:  Post-procedure Vital Signs:  Pulse/HCG Rate: 7073 Temp: 98.3 F (36.8 C) Resp: 16 BP: (!) 137/96 SpO2: 100 %  EBL: None  Complications: No immediate post-treatment complications observed by team, or reported by patient.  Note: The patient tolerated the entire procedure well. A repeat set of vitals were taken after the procedure and the patient was kept under observation following institutional policy, for this type of procedure. Post-procedural neurological assessment was performed, showing return to baseline, prior to discharge. The patient was provided with post-procedure discharge instructions, including a section on how to identify potential problems. Should any problems arise concerning this procedure, the patient was given instructions to immediately contact us, at any time, without hesitation. In any case, we plan to contact the patient by telephone for a follow-up status report regarding this interventional procedure.  Comments:  No additional relevant information.  Plan of Care    Imaging Orders     DG C-Arm 1-60 Min-No Report Procedure Orders    No procedure(s) ordered today    Medications ordered for procedure: Meds ordered this encounter  Medications  . iopamidol (ISOVUE-M) 41 % intrathecal injection 10 mL  . ropivacaine (PF) 2 mg/mL (0.2%) (NAROPIN) injection 2 mL  . sodium chloride flush (NS) 0.9 % injection 2 mL  . lidocaine (XYLOCAINE) 2 % (with pres) injection 200 mg  . dexamethasone (DECADRON) injection 10 mg   Medications administered: We administered iopamidol,  ropivacaine (PF) 2 mg/mL (0.2%), sodium chloride flush, lidocaine, and dexamethasone.  See the medical record for exact dosing, route, and time of administration.  Disposition: Discharge home  Discharge Date & Time: 09/13/2018; 0950 hrs.   Physician-requested Follow-up: Return in about 3 weeks (around 10/04/2018) for Post Procedure Evaluation.  Future Appointments  Date Time Provider [place]  10/03/2018  2:30 PM Gillis Santa, MD [facility] None   Primary Care Physician: Derinda Late, MD Location: [facility] Note by: Gillis Santa, MD Date: 09/13/2018; Time: 11:31 AM  Disclaimer:  Medicine is not an exact science. The only guarantee in medicine is that nothing is guaranteed. It is important to note that the decision to proceed with this intervention was based on the information collected from the patient. The Data and conclusions were drawn from the patient's questionnaire, the interview, and the physical examination. Because the information was provided in large part by the patient, it cannot be guaranteed that it has not been purposely or unconsciously manipulated. Every effort has been made to obtain as much relevant data as possible for this evaluation. It is important to note that the conclusions that lead to this procedure are derived in large part from the available data. Always take into account that the treatment will also be dependent on availability of resources and existing treatment guidelines, considered by other Pain Management Practitioners as being common knowledge and practice, at the time of the intervention. For Medico-Legal purposes, it is also important to point out that variation in procedural techniques and pharmacological choices are the acceptable norm. The indications, contraindications, technique, and results of the above procedure should only be interpreted and judged by a Board-Certified Interventional Pain Specialist with  extensive familiarity and expertise in the same exact procedure and technique.

## 2018-09-13 NOTE — Patient Instructions (Signed)

## 2018-09-13 NOTE — Progress Notes (Signed)
Patient's Name: Alex Jenkins  MRN: [id number]  Referring Provider: Harvest Dark, FNP  DOB: [birth year]  PCP: Derinda Late, MD  DOS: 09/13/2018  Note by: Gillis Santa, MD  Service setting: Ambulatory outpatient  Specialty: Interventional Pain Management  Location: [facility] (AMB) Pain Management Facility    Patient type: New patient ("FAST-TRACK" Evaluation)   Warning: This referral option does not include the extensive pharmacological evaluation required for [place] to take over the patient's medication management. The "Fast-Track" system is designed to bypass the new patient referral waiting list, as well as the normal patient evaluation process, in order to provide a patient in distress with a timely pain management intervention. Because the system was not designed to unfairly get a patient into our pain practice ahead of those already waiting, certain restrictions apply. By requesting a "Fast-Track" consult, the referring physician has opted to continue managing the patient's medications in order to get interventional urgent care.  Primary Reason for Visit: Interventional Pain Management Treatment. CC: Back Pain (mid); Abdominal Pain (right); and Neck Pain   Procedure  HPI  Alex Jenkins is a 42 y.o. year old, male patient, who comes today for a  "Fast-Track" new patient evaluation, as requested by Meeler, Sherren Kerns, FNP. The patient has been made aware that this type of referral option is reserved for the Interventional Pain Management portion of our practice and completely excludes the option of medication management. His primarily concern today is the Back Pain (mid); Abdominal Pain (right); and Neck Pain  Pain Assessment: Location: Mid Back Radiating: denies Onset: More than a month ago Duration: Chronic pain Quality: Nagging Severity: 1 /10 (subjective, self-reported pain score)  Note: Reported level is compatible with observation.                         When using our objective Pain  Scale, levels between 6 and 10/10 are said to belong in an emergency room, as it progressively worsens from a 6/10, described as severely limiting, requiring emergency care not usually available at an outpatient pain management facility. At a 6/10 level, communication becomes difficult and requires great effort. Assistance to reach the emergency department may be required. Facial flushing and profuse sweating along with potentially dangerous increases in heart rate and blood pressure will be evident. Effect on ADL: limits daily activities Timing: Intermittent Modifying factors: sitting, prednisone BP: (!) 137/96  HR: 70  Onset and Duration: Gradual and Present longer than 3 months Cause of pain: Unknown Severity: No change since onset 7 at its worst, 0 at its best, currently 1 Timing: Not influenced by the time of the day, During activity or exercise, After activity or exercise and After a period of immobility Aggravating Factors: Lifiting, Prolonged standing, Twisting, Walking and Working Alleviating Factors: Lying down, Resting and Warm showers or baths Associated Problems: Numbness, Tingling and Weakness Quality of Pain: Annoying, Burning, Dull, Nagging and Uncomfortable Previous Examinations or Tests: CT scan, MRI scan, Neurosurgical evaluation and Chiropractic evaluation Previous Treatments: Chiropractic manipulations, Narcotic medications, Relaxation therapy and Strengthening exercises  The patient comes into the clinics today, referred to [place] for a thoracic epidural steroid injection for thoracic radiculopathy secondary to T11-T12 disc bulge affecting the T11 nerve root.  Patient endorses right-sided thoracic back pain that is been going on for the last 10 months.  He describes pain and tingling that radiates anteriorly into his abdomen.  Pain is present every day.  Sitting or laying flat in  a recliner helps out with his symptoms.  Patient  has left cervical foraminal stenosis at C6 with  possible left C6 radiculitis as well which is contributing to his neck pain that radiates into his left upper extremity.  Patient has some numbness in his upper arm and anterior forearm.   Patient is tried chiropractic therapy without any benefit.  Tried Flexeril which she did not like the side effects nor was it effective.  Has tried meloxicam with little relief.  Also status post steroid therapy without any benefit.   Meds   Current Outpatient Medications:  .  fluticasone (FLONASE) 50 MCG/ACT nasal spray, Place into both nostrils daily., Disp: , Rfl:  .  KRILL OIL PO, Take by mouth., Disp: , Rfl:  .  Multiple Vitamin (MULTIVITAMIN) tablet, Take 1 tablet by mouth daily., Disp: , Rfl:  .  pantoprazole (PROTONIX) 40 MG tablet, Take 40 mg by mouth 2 (two) times daily., Disp: , Rfl:  .  predniSONE (DELTASONE) 10 MG tablet, Take 10 mg by mouth daily with breakfast., Disp: , Rfl:  .  valsartan (DIOVAN) 40 MG tablet, Take 40 mg by mouth daily., Disp: , Rfl:  .  vitamin B-12 (CYANOCOBALAMIN) 250 MCG tablet, Take 250 mcg by mouth daily., Disp: , Rfl:  .  HYDROcodone-acetaminophen (NORCO) 5-325 MG tablet, Take 1 tablet by mouth every 4 (four) hours as needed for moderate pain. (Patient not taking: Reported on 09/13/2018), Disp: 6 tablet, Rfl: 0  Imaging Review  Cervical Imaging: Cervical MR wo contrast:  Results for orders placed during the hospital encounter of 09/07/18  MR CERVICAL SPINE WO CONTRAST   Narrative CLINICAL DATA:  42 year old male with onset of mid back and right rib pain 8-10 months ago. New onset of left side neck pain radiating to the left arm with numbness 3-4 weeks ago.  EXAM: MRI CERVICAL SPINE WITHOUT CONTRAST  TECHNIQUE: Multiplanar, multisequence MR imaging of the cervical spine was performed. No intravenous contrast was administered.  COMPARISON:  None.  FINDINGS: Alignment: Preserved cervical lordosis.  No spondylolisthesis.  Vertebrae: No marrow edema or  evidence of acute osseous abnormality. Visualized bone marrow signal is within normal limits.  Cord: Thin linear central lower cervical spinal cord T2 hyperintensity. This is not apparent on the sagittal T1 weighted imaging and is most compatible with mild enlargement of the central spinal canal (normal variant). Otherwise normal spinal cord signal and morphology.  Posterior Fossa, vertebral arteries, paraspinal tissues: Cervicomedullary junction is within normal limits. Negative visible posterior fossa. There is a right cerebellar developmental venous anomaly suspected (normal variant). Preserved major vascular flow voids in the neck.  Patchy area of mild left erector spinae muscle edema at the C5-C6 level (series 8, image 13 and series 9, image 22), likely related to the neural impingement there described below. Otherwise negative paraspinal soft tissues.  Negative visible left lung apex.  Disc levels:  C2-C3:  Negative.  C3-C4: Mild foraminal disc bulging and endplate spurring. Mild to moderate bilateral C4 foraminal stenosis.  C4-C5: Minimal posterior disc bulging. Mild foraminal endplate spurring primarily on the right. Mild right C5 foraminal stenosis.  C5-C6: Bulky left foraminal disc extrusion (series 7 image 10, series 9, images 20 and 21). Severe left C6 foraminal stenosis. Disc material also effaces the left lateral recess, but there is no significant spinal stenosis. The right foramen remains normal.  C6-C7: Suspicion of a left foraminal annular fissure of the disc on series 9, image 24. Minimal disc bulging. No stenosis.  C7-T1:  Mild facet hypertrophy.  No stenosis.  IMPRESSION: 1. Bulky leftward disc extrusion at C5-C6 mostly affecting the left neural foramen. Severe foraminal stenosis, query left C6 radiculitis. 2. Generally mild cervical spine degeneration elsewhere. Left-sided annular fissure of the C6-C7 disc also suspected.   Electronically  Signed   By: Genevie Ann M.D.   On: 09/07/2018 08:50      Thoracic Imaging: Thoracic MR wo contrast:  Results for orders placed during the hospital encounter of 09/07/18  MR THORACIC SPINE WO CONTRAST   Narrative CLINICAL DATA:  42 year old male with onset of mid back and right rib pain 8-10 months ago. New onset of left side neck pain radiating to the left arm with numbness 3-4 weeks ago.  EXAM: MRI THORACIC SPINE WITHOUT CONTRAST  TECHNIQUE: Multiplanar, multisequence MR imaging of the thoracic spine was performed. No intravenous contrast was administered.  COMPARISON:  Cervical spine MRI today reported separately.  FINDINGS: Limited cervical spine imaging:  Reported separately today.  Thoracic spine segmentation:  Appears normal.  Alignment:  Normal thoracic kyphosis.  No spondylolisthesis.  Vertebrae: Widespread mid and lower thoracic endplate Schmorl's nodes, chronic. No marrow edema or evidence of acute osseous abnormality. Background bone marrow signal appears within normal limits.  Cord: Faint enlargement of the central spinal canal suspected in the lower cervical and upper thoracic spine (series 20, image 3 at T1-T2). Normal spinal cord signal and morphology above the T11 level. No definite cord signal abnormality in the lower thoracic spine. The conus appears to be at T12.  Paraspinal and other soft tissues: Negative visible chest and upper abdominal viscera.  Disc levels:  T1-T2: Negative.  T2-T3: Minimal central disc bulging.  No stenosis.  T3-T4: Negative.  T4-T5: Mild right T4 foraminal stenosis appears related to facet hypertrophy.  T5-T6: Mild facet hypertrophy. Borderline to mild left T5 foraminal stenosis.  T6-T7: Mild facet hypertrophy. Borderline to mild bilateral T6 foraminal stenosis.  T7-T8: Mild facet and ligament flavum hypertrophy.  No stenosis.  T8-T9: Mild facet and ligament flavum hypertrophy. Mild bilateral T8 foraminal  stenosis.  T9-T10: Mild facet hypertrophy. Borderline to mild bilateral T9 foraminal stenosis.  T10-T11: Mild facet and ligament flavum hypertrophy. Mild to moderate bilateral T10 foraminal stenosis.  T11-T12: Broad-based posterior disc bulge or protrusion resulting in spinal stenosis with mild spinal cord mass effect (series 19, image 9 and series 20, image 31). Associated mild to moderate T11 foraminal stenosis greater on the right.  T12-L1: Negative.  IMPRESSION: 1. Positive for degenerative lower thoracic spinal stenosis at T11-T12 with mild cord compression related to broad-based disc bulging. No associated spinal cord signal abnormality. Mild to moderate associated T11 foraminal stenosis. 2. Chronic endplate Schmorl's nodes but otherwise minimal thoracic disc degeneration elsewhere. Thoracic facet and ligament flavum hypertrophy resulting in intermittent mild thoracic neural foraminal stenosis.   Electronically Signed   By: Genevie Ann M.D.   On: 09/07/2018 09:08    Hand Imaging: Hand-R DG Complete:  Results for orders placed during the hospital encounter of 07/14/17  DG Hand Complete Right   Narrative CLINICAL DATA:  Acute onset of right hand pain and swelling. Initial encounter.  EXAM: RIGHT HAND - COMPLETE 3+ VIEW  COMPARISON:  None.  FINDINGS: There is no evidence of fracture or dislocation. The joint spaces are preserved. The carpal rows are intact, and demonstrate normal alignment. Negative ulnar variance is noted. The soft tissues are unremarkable in appearance.  IMPRESSION: No evidence of fracture or dislocation.  Electronically Signed   By: Garald Balding M.D.   On: 07/14/2017 00:37    Hand-L DG Complete: No results found for this or any previous visit.  Complexity Note: Imaging results reviewed. Results shared with Alex Jenkins, using Layman's terms.                         ROS  Cardiovascular: High blood pressure Pulmonary or Respiratory:  No reported pulmonary signs or symptoms such as wheezing and difficulty taking a deep full breath (Asthma), difficulty blowing air out (Emphysema), coughing up mucus (Bronchitis), persistent dry cough, or temporary stoppage of breathing during sleep Neurological: No reported neurological signs or symptoms such as seizures, abnormal skin sensations, urinary and/or fecal incontinence, being born with an abnormal open spine and/or a tethered spinal cord Review of Past Neurological Studies: No results found for this or any previous visit. Psychological-Psychiatric: No reported psychological or psychiatric signs or symptoms such as difficulty sleeping, anxiety, depression, delusions or hallucinations (schizophrenial), mood swings (bipolar disorders) or suicidal ideations or attempts Gastrointestinal: Reflux or heatburn Genitourinary: No reported renal or genitourinary signs or symptoms such as difficulty voiding or producing urine, peeing blood, non-functioning kidney, kidney stones, difficulty emptying the bladder, difficulty controlling the flow of urine, or chronic kidney disease Hematological: No reported hematological signs or symptoms such as prolonged bleeding, low or poor functioning platelets, bruising or bleeding easily, hereditary bleeding problems, low energy levels due to low hemoglobin or being anemic Endocrine: No reported endocrine signs or symptoms such as high or low blood sugar, rapid heart rate due to high thyroid levels, obesity or weight gain due to slow thyroid or thyroid disease Rheumatologic: No reported rheumatological signs and symptoms such as fatigue, joint pain, tenderness, swelling, redness, heat, stiffness, decreased range of motion, with or without associated rash Musculoskeletal: Negative for myasthenia gravis, muscular dystrophy, multiple sclerosis or malignant hyperthermia Work History: Working full time  Allergies  Alex Jenkins has No Known Allergies.  Laboratory  Chemistry  Inflammation Markers (CRP: Acute Phase) (ESR: Chronic Phase) No results found for: CRP, ESRSEDRATE, LATICACIDVEN                       Rheumatology Markers No results found for: RF, ANA, LABURIC, URICUR, LYMEIGGIGMAB, LYMEABIGMQN, HLAB27                      Renal Function Markers Lab Results  Component Value Date   BUN 15 07/14/2017   CREATININE 0.83 07/14/2017   GFRAA >60 07/14/2017   GFRNONAA >60 07/14/2017                             Hepatic Function Markers Lab Results  Component Value Date   AST 41 (H) 01/03/2013   ALT 52 01/03/2013   ALBUMIN 4.4 01/03/2013   ALKPHOS 86 01/03/2013   LIPASE 159 01/03/2013                        Electrolytes Lab Results  Component Value Date   NA 139 07/14/2017   K 4.0 07/14/2017   CL 107 07/14/2017   CALCIUM 9.9 07/14/2017                        Neuropathy Markers No results found for: VITAMINB12, FOLATE, HGBA1C, HIV  CNS Tests No results found for: COLORCSF, APPEARCSF, RBCCOUNTCSF, WBCCSF, POLYSCSF, LYMPHSCSF, EOSCSF, PROTEINCSF, GLUCCSF, JCVIRUS, CSFOLI, IGGCSF                      Bone Pathology Markers No results found for: VD25OH, [id number], [id number], WF0932TF5, 25OHVITD1, 25OHVITD2, 25OHVITD3, TESTOFREE, TESTOSTERONE                       Coagulation Parameters Lab Results  Component Value Date   PLT 207 07/14/2017                        Cardiovascular Markers Lab Results  Component Value Date   HGB 14.5 07/14/2017   HCT 40.4 07/14/2017                         CA Markers No results found for: CEA, CA125, LABCA2                      Note: Lab results reviewed.  [place]  Drug: Alex Jenkins  reports that he does not use drugs. Alcohol:  reports that he does not drink alcohol. Tobacco:  reports that he has never smoked. He has never used smokeless tobacco. Medical:  has a past medical history of Hypertension. Family: family history is not on file.  Past Surgical History:   Procedure Laterality Date  . CHOLECYSTECTOMY     Active Ambulatory Problems    Diagnosis Date Noted  . Thoracic radiculopathy (R-T11/T12) 09/13/2018  . Thoracic spinal stenosis (T11/T12) 09/13/2018  . Cervical radiculopathy at C6 (LEFT) 09/13/2018  . Foraminal stenosis of cervical region 09/13/2018   Resolved Ambulatory Problems    Diagnosis Date Noted  . No Resolved Ambulatory Problems   Past Medical History:  Diagnosis Date  . Hypertension    Constitutional Exam  General appearance: Well nourished, well developed, and well hydrated. In no apparent acute distress Vitals:   09/13/18 0928 09/13/18 0933 09/13/18 0938 09/13/18 0947  BP: 136/90 131/78 135/81 (!) 137/96  Pulse:      Resp: 14 19 15 16   Temp:      TempSrc:      SpO2: 100% 100% 100% 100%  Weight:      Height:       BMI Assessment: Estimated body mass index is 40.79 kg/m as calculated from the following:   Height as of this encounter: 6' 2.5" (1.892 m).   Weight as of this encounter: 322 lb (146.1 kg).  BMI interpretation table: BMI level Category Range association with higher incidence of chronic pain  <18 kg/m2 Underweight   18.5-24.9 kg/m2 Ideal body weight   25-29.9 kg/m2 Overweight Increased incidence by 20%  30-34.9 kg/m2 Obese (Class I) Increased incidence by 68%  35-39.9 kg/m2 Severe obesity (Class II) Increased incidence by 136%  >40 kg/m2 Extreme obesity (Class III) Increased incidence by 254%   Patient's current BMI Ideal Body weight  Body mass index is 40.79 kg/m. Ideal body weight: 83.4 kg (183 lb 12.1 oz) Adjusted ideal body weight: 108.4 kg (239 lb 0.8 oz)   BMI Readings from Last 4 Encounters:  09/13/18 40.79 kg/m  07/14/17 37.88 kg/m   Wt Readings from Last 4 Encounters:  09/13/18 (!) 322 lb (146.1 kg)  07/14/17 295 lb (133.8 kg)  Psych/Mental status: Alert, oriented x 3 (person, place, & time)       Eyes: PERLA Respiratory:  No evidence of acute respiratory distress  Cervical  Spine Area Exam  Skin & Axial Inspection: No masses, redness, edema, swelling, or associated skin lesions Alignment: Symmetrical Functional ROM: Decreased ROM, to the left Stability: No instability detected Muscle Tone/Strength: Functionally intact. No obvious neuro-muscular anomalies detected. Sensory (Neurological): Dermatomal pain pattern Palpation: No palpable anomalies             Positive Spurling's on the left Upper Extremity (UE) Exam    Side: Right upper extremity  Side: Left upper extremity  Skin & Extremity Inspection: Skin color, temperature, and hair growth are WNL. No peripheral edema or cyanosis. No masses, redness, swelling, asymmetry, or associated skin lesions. No contractures.  Skin & Extremity Inspection: Skin color, temperature, and hair growth are WNL. No peripheral edema or cyanosis. No masses, redness, swelling, asymmetry, or associated skin lesions. No contractures.  Functional ROM: Unrestricted ROM          Functional ROM: Unrestricted ROM          Muscle Tone/Strength: Functionally intact. No obvious neuro-muscular anomalies detected.  Muscle Tone/Strength: Functionally intact. No obvious neuro-muscular anomalies detected.  Sensory (Neurological): Unimpaired          Sensory (Neurological): Dermatomal pain pattern          Palpation: No palpable anomalies              Palpation: No palpable anomalies              Provocative Test(s):  Phalen's test: deferred Tinel's test: deferred Apley's scratch test (touch opposite shoulder):  Action 1 (Across chest): deferred Action 2 (Overhead): deferred Action 3 (LB reach): deferred   Provocative Test(s):  Phalen's test: deferred Tinel's test: deferred Apley's scratch test (touch opposite shoulder):  Action 1 (Across chest): deferred Action 2 (Overhead): deferred Action 3 (LB reach): deferred    Thoracic Spine Area Exam  Skin & Axial Inspection: No masses, redness, or swelling Alignment: Symmetrical Functional ROM:  Decreased ROM Stability: No instability detected Muscle Tone/Strength: Functionally intact. No obvious neuro-muscular anomalies detected. Sensory (Neurological): Dermatomal pain pattern radiates to anterior abdomen consistent with T11-T12 radiculopathy. Muscle strength & Tone: No palpable anomalies   Lumbar Spine Area Exam  Skin & Axial Inspection: No masses, redness, or swelling Alignment: Symmetrical Functional ROM: Decreased ROM       Stability: No instability detected Muscle Tone/Strength: Functionally intact. No obvious neuro-muscular anomalies detected. Sensory (Neurological): Dermatomal pain pattern Palpation: No palpable anomalies       Provocative Tests: Hyperextension/rotation test: (+) bilaterally for facet joint pain. Lumbar quadrant test (Kemp's test): (+) due to pain. Lateral bending test: (+) due to pain. Patrick's Maneuver: deferred today                   FABER test: deferred today                   S-I anterior distraction/compression test: deferred today         S-I lateral compression test: deferred today         S-I Thigh-thrust test: deferred today         S-I Gaenslen's test: deferred today          Gait & Posture Assessment  Ambulation: Unassisted Gait: Relatively normal for age and body habitus Posture: WNL   Lower Extremity Exam    Side: Right lower extremity  Side: Left lower extremity  Stability: No instability observed  Stability: No instability observed          Skin & Extremity Inspection: Skin color, temperature, and hair growth are WNL. No peripheral edema or cyanosis. No masses, redness, swelling, asymmetry, or associated skin lesions. No contractures.  Skin & Extremity Inspection: Skin color, temperature, and hair growth are WNL. No peripheral edema or cyanosis. No masses, redness, swelling, asymmetry, or associated skin lesions. No contractures.  Functional ROM: Unrestricted ROM                  Functional ROM: Unrestricted ROM                   Muscle Tone/Strength: Functionally intact. No obvious neuro-muscular anomalies detected.  Muscle Tone/Strength: Functionally intact. No obvious neuro-muscular anomalies detected.  Sensory (Neurological): Unimpaired  Sensory (Neurological): Unimpaired  Palpation: No palpable anomalies  Palpation: No palpable anomalies    Procedure  42 year old male who presents with a chief complaint of right thoracic back pain that radiates into his right abdomen related to thoracic radiculopathy from disc herniation at T11-T12 resulting in T11 and T12 dermatome pain.  Patient also has left neck pain that radiates to his left arm along with paresthesias consistent with left C6 radiculopathy.  Patient states that thoracic back pain is very debilitating.  We discussed thoracic epidural steroid injection at T11-T12 for his thoracic radicular symptoms.  Risks and benefits of this procedure were discussed.  Patient like to proceed.  See procedure note.

## 2018-09-14 ENCOUNTER — Telehealth: Payer: Self-pay | Admitting: *Deleted

## 2018-09-14 NOTE — Telephone Encounter (Signed)
No problems post procedure. 

## 2018-10-03 ENCOUNTER — Ambulatory Visit
Payer: BLUE CROSS/BLUE SHIELD | Attending: Student in an Organized Health Care Education/Training Program | Admitting: Student in an Organized Health Care Education/Training Program

## 2018-10-03 ENCOUNTER — Encounter: Payer: Self-pay | Admitting: Student in an Organized Health Care Education/Training Program

## 2018-10-03 ENCOUNTER — Other Ambulatory Visit: Payer: Self-pay

## 2018-10-03 VITALS — BP 135/85 | HR 73 | Temp 98.5°F | Resp 16 | Ht 74.5 in | Wt 325.0 lb

## 2018-10-03 DIAGNOSIS — M4802 Spinal stenosis, cervical region: Secondary | ICD-10-CM | POA: Diagnosis not present

## 2018-10-03 DIAGNOSIS — M4804 Spinal stenosis, thoracic region: Secondary | ICD-10-CM

## 2018-10-03 DIAGNOSIS — M5414 Radiculopathy, thoracic region: Secondary | ICD-10-CM | POA: Diagnosis not present

## 2018-10-03 DIAGNOSIS — M5412 Radiculopathy, cervical region: Secondary | ICD-10-CM | POA: Diagnosis not present

## 2018-10-03 MED ORDER — METHYLPREDNISOLONE 4 MG PO TBPK: ORAL_TABLET | ORAL | 0 refills | Status: AC

## 2018-10-03 NOTE — Progress Notes (Signed)
Patient's Name: Alex Jenkins  MRN: [id number]  Referring Provider: Derinda Late, MD  DOB: 1976-09-23  PCP: Derinda Late, MD  DOS: 10/03/2018  Note by: Gillis Santa, MD  Service setting: Ambulatory outpatient  Specialty: Interventional Pain Management  Location: [facility] (AMB) Pain Management Facility    Patient type: Established   Primary Reason(s) for Visit: Encounter for post-procedure evaluation of chronic illness with mild to moderate exacerbation CC: Back Pain (right mid); Abdominal Pain (right); and Arm Pain (left; numbness)  HPI  Alex Jenkins is a 42 y.o. year old, male patient, who comes today for a post-procedure evaluation. He has Thoracic radiculopathy (R-T11/T12); Thoracic spinal stenosis (T11/T12); Cervical radiculopathy at C6 (LEFT); and Foraminal stenosis of cervical region on their problem list. His primarily concern today is the Back Pain (right mid); Abdominal Pain (right); and Arm Pain (left; numbness)  Pain Assessment: Location: Right, Mid Back Radiating: denies Onset: More than a month ago Duration: Chronic pain Quality: ("sometimes it burns") Severity: 5 /10 (subjective, self-reported pain score)  Note: Reported level is compatible with observation.                         When using our objective Pain Scale, levels between 6 and 10/10 are said to belong in an emergency room, as it progressively worsens from a 6/10, described as severely limiting, requiring emergency care not usually available at an outpatient pain management facility. At a 6/10 level, communication becomes difficult and requires great effort. Assistance to reach the emergency department may be required. Facial flushing and profuse sweating along with potentially dangerous increases in heart rate and blood pressure will be evident. Effect on ADL: limits daily activities Timing: Intermittent Modifying factors: sitting, prednisone BP: 135/85  HR: 73  Alex Jenkins comes in today for post-procedure  evaluation.  Further details on both, my assessment(s), as well as the proposed treatment plan, please see below.  Post-Procedure Assessment  09/13/2018 Procedure: Right T12-L1 ESI Pre-procedure pain score:  1/10 Post-procedure pain score: 1/10         Influential Factors: BMI: 41.17 kg/m Intra-procedural challenges: None observed.         Assessment challenges: None detected.              Reported side-effects: None.        Post-procedural adverse reactions or complications: None reported         Sedation: Please see nurses note. When no sedatives are used, the analgesic levels obtained are directly associated to the effectiveness of the local anesthetics. However, when sedation is provided, the level of analgesia obtained during the initial 1 hour following the intervention, is believed to be the result of a combination of factors. These factors may include, but are not limited to: 1. The effectiveness of the local anesthetics used. 2. The effects of the analgesic(s) and/or anxiolytic(s) used. 3. The degree of discomfort experienced by the patient at the time of the procedure. 4. The patients ability and reliability in recalling and recording the events. 5. The presence and influence of possible secondary gains and/or psychosocial factors. Reported result: Relief experienced during the 1st hour after the procedure: 100 % (Ultra-Short Term Relief)            Interpretative annotation: Clinically appropriate result. Analgesia during this period is likely to be Local Anesthetic and/or IV Sedative (Analgesic/Anxiolytic) related.          Effects of local anesthetic: The analgesic effects attained  during this period are directly associated to the localized infiltration of local anesthetics and therefore cary significant diagnostic value as to the etiological location, or anatomical origin, of the pain. Expected duration of relief is directly dependent on the pharmacodynamics of the local  anesthetic used. Long-acting (4-6 hours) anesthetics used.  Reported result: Relief during the next 4 to 6 hour after the procedure: 0 % (Short-Term Relief)            Interpretative annotation: Unexpected result. Analgesia during this period is likely to be Local Anesthetic-related.          Long-term benefit: Defined as the period of time past the expected duration of local anesthetics (1 hour for short-acting and 4-6 hours for long-acting). With the possible exception of prolonged sympathetic blockade from the local anesthetics, benefits during this period are typically attributed to, or associated with, other factors such as analgesic sensory neuropraxia, antiinflammatory effects, or beneficial biochemical changes provided by agents other than the local anesthetics.  Reported result: Extended relief following procedure: 0 % (Long-Term Relief)            Interpretative annotation: Expected clinical result. No benefit. No permanent benefit expected. Pain appears to be refractory to this treatment modality.          Current benefits: Defined as reported results that persistent at this point in time.   Analgesia: 0 %            Function: No benefit ROM: No benefit Interpretative annotation: No benefit. Therapeutic failure. Results would argue against repeating therapy.          Interpretation: Results would suggest failure of therapy in achieving desired goal(s).                  Plan:  Please see "Plan of Care" for details.                Laboratory Chemistry  Inflammation Markers (CRP: Acute Phase) (ESR: Chronic Phase) No results found for: CRP, ESRSEDRATE, LATICACIDVEN                       Rheumatology Markers No results found for: RF, ANA, LABURIC, URICUR, LYMEIGGIGMAB, LYMEABIGMQN, HLAB27                      Renal Function Markers Lab Results  Component Value Date   BUN 15 07/14/2017   CREATININE 0.83 07/14/2017   GFRAA >60 07/14/2017   GFRNONAA >60 07/14/2017                              Hepatic Function Markers Lab Results  Component Value Date   AST 41 (H) 01/03/2013   ALT 52 01/03/2013   ALBUMIN 4.4 01/03/2013   ALKPHOS 86 01/03/2013   LIPASE 159 01/03/2013                        Electrolytes Lab Results  Component Value Date   NA 139 07/14/2017   K 4.0 07/14/2017   CL 107 07/14/2017   CALCIUM 9.9 07/14/2017                        Neuropathy Markers No results found for: VITAMINB12, FOLATE, HGBA1C, HIV  CNS Tests No results found for: COLORCSF, APPEARCSF, RBCCOUNTCSF, WBCCSF, POLYSCSF, LYMPHSCSF, EOSCSF, PROTEINCSF, GLUCCSF, JCVIRUS, CSFOLI, IGGCSF                      Bone Pathology Markers No results found for: VD25OH, [id number], XI5038UE2, CM0349ZP9, 25OHVITD1, 25OHVITD2, 25OHVITD3, TESTOFREE, TESTOSTERONE                       Coagulation Parameters Lab Results  Component Value Date   PLT 207 07/14/2017                        Cardiovascular Markers Lab Results  Component Value Date   HGB 14.5 07/14/2017   HCT 40.4 07/14/2017                         CA Markers No results found for: CEA, CA125, LABCA2                      Note: Lab results reviewed.  Recent Diagnostic Imaging Results  DG C-Arm 1-60 Min-No Report Fluoroscopy was utilized by the requesting physician.  No radiographic  interpretation.   Complexity Note: Imaging results reviewed. Results shared with Alex Jenkins, using Layman's terms.                         Meds   Current Outpatient Medications:  .  fluticasone (FLONASE) 50 MCG/ACT nasal spray, Place into both nostrils daily., Disp: , Rfl:  .  KRILL OIL PO, Take by mouth., Disp: , Rfl:  .  Multiple Vitamin (MULTIVITAMIN) tablet, Take 1 tablet by mouth daily., Disp: , Rfl:  .  pantoprazole (PROTONIX) 40 MG tablet, Take 40 mg by mouth 2 (two) times daily., Disp: , Rfl:  .  valsartan (DIOVAN) 40 MG tablet, Take 40 mg by mouth daily., Disp: , Rfl:  .  vitamin B-12 (CYANOCOBALAMIN) 250 MCG  tablet, Take 250 mcg by mouth daily., Disp: , Rfl:  .  methylPREDNISolone (MEDROL) 4 MG TBPK tablet, Follow package instructions., Disp: 21 tablet, Rfl: 0 .  predniSONE (DELTASONE) 10 MG tablet, Take 10 mg by mouth daily with breakfast., Disp: , Rfl:   ROS  Constitutional: Denies any fever or chills Gastrointestinal: No reported hemesis, hematochezia, vomiting, or acute GI distress Musculoskeletal: Denies any acute onset joint swelling, redness, loss of ROM, or weakness Neurological: No reported episodes of acute onset apraxia, aphasia, dysarthria, agnosia, amnesia, paralysis, loss of coordination, or loss of consciousness  Allergies  Alex Jenkins has No Known Allergies.  [place]  Drug: Alex Jenkins  reports that he does not use drugs. Alcohol:  reports that he does not drink alcohol. Tobacco:  reports that he has never smoked. He has never used smokeless tobacco. Medical:  has a past medical history of Hypertension. Surgical: Alex Jenkins  has a past surgical history that includes Cholecystectomy. Family: family history is not on file.  Constitutional Exam  General appearance: Well nourished, well developed, and well hydrated. In no apparent acute distress Vitals:   10/03/18 1431  BP: 135/85  Pulse: 73  Resp: 16  Temp: 98.5 F (36.9 C)  TempSrc: Oral  SpO2: 100%  Weight: (!) 325 lb (147.4 kg)  Height: 6' 2.5" (1.892 m)   BMI Assessment: Estimated body mass index is 41.17 kg/m as calculated from the following:   Height as of this encounter:  6' 2.5" (1.892 m).   Weight as of this encounter: 325 lb (147.4 kg).  BMI interpretation table: BMI level Category Range association with higher incidence of chronic pain  <18 kg/m2 Underweight   18.5-24.9 kg/m2 Ideal body weight   25-29.9 kg/m2 Overweight Increased incidence by 20%  30-34.9 kg/m2 Obese (Class I) Increased incidence by 68%  35-39.9 kg/m2 Severe obesity (Class II) Increased incidence by 136%  >40 kg/m2 Extreme obesity  (Class III) Increased incidence by 254%   Patient's current BMI Ideal Body weight  Body mass index is 41.17 kg/m. Ideal body weight: 83.4 kg (183 lb 12.1 oz) Adjusted ideal body weight: 109 kg (240 lb 4 oz)   BMI Readings from Last 4 Encounters:  10/03/18 41.17 kg/m  09/13/18 40.79 kg/m  07/14/17 37.88 kg/m   Wt Readings from Last 4 Encounters:  10/03/18 (!) 325 lb (147.4 kg)  09/13/18 (!) 322 lb (146.1 kg)  07/14/17 295 lb (133.8 kg)  Psych/Mental status: Alert, oriented x 3 (person, place, & time)       Eyes: PERLA Respiratory: No evidence of acute respiratory distress  Cervical Spine Area Exam  Skin & Axial Inspection: No masses, redness, edema, swelling, or associated skin lesions Alignment: Symmetrical Functional ROM: Unrestricted ROM      Stability: No instability detected Muscle Tone/Strength: Functionally intact. No obvious neuro-muscular anomalies detected. Sensory (Neurological): Dermatomal pain pattern left forearm and hand Palpation: No palpable anomalies              Upper Extremity (UE) Exam    Side: Right upper extremity  Side: Left upper extremity  Skin & Extremity Inspection: Skin color, temperature, and hair growth are WNL. No peripheral edema or cyanosis. No masses, redness, swelling, asymmetry, or associated skin lesions. No contractures.  Skin & Extremity Inspection: Skin color, temperature, and hair growth are WNL. No peripheral edema or cyanosis. No masses, redness, swelling, asymmetry, or associated skin lesions. No contractures.  Functional ROM: Unrestricted ROM          Functional ROM: Unrestricted ROM          Muscle Tone/Strength: Functionally intact. No obvious neuro-muscular anomalies detected.  Muscle Tone/Strength: Functionally intact. No obvious neuro-muscular anomalies detected.  Sensory (Neurological): Unimpaired          Sensory (Neurological): Dermatomal pain pattern affecting all joints of upper extremity  Palpation: No palpable anomalies               Palpation: No palpable anomalies              Provocative Test(s):  Phalen's test: deferred Tinel's test: deferred Apley's scratch test (touch opposite shoulder):  Action 1 (Across chest): deferred Action 2 (Overhead): deferred Action 3 (LB reach): deferred   Provocative Test(s):  Phalen's test: deferred Tinel's test: deferred Apley's scratch test (touch opposite shoulder):  Action 1 (Across chest): deferred Action 2 (Overhead): deferred Action 3 (LB reach): deferred    Thoracic Spine Area Exam  Skin & Axial Inspection: No masses, redness, or swelling Alignment: Symmetrical Functional ROM: Adequate ROM Stability: No instability detected Muscle Tone/Strength: Functionally intact. No obvious neuro-muscular anomalies detected. Sensory (Neurological): Dermatomal pain pattern Muscle strength & Tone: No palpable anomalies  Lumbar Spine Area Exam  Skin & Axial Inspection: No masses, redness, or swelling Alignment: Symmetrical Functional ROM: Unrestricted ROM       Stability: No instability detected Muscle Tone/Strength: Functionally intact. No obvious neuro-muscular anomalies detected. Sensory (Neurological): Unimpaired Palpation: No palpable anomalies  Provocative Tests: Hyperextension/rotation test: deferred today       Lumbar quadrant test (Kemp's test): deferred today       Lateral bending test: deferred today       Patrick's Maneuver: deferred today                   FABER test: deferred today                   S-I anterior distraction/compression test: deferred today         S-I lateral compression test: deferred today         S-I Thigh-thrust test: deferred today         S-I Gaenslen's test: deferred today          Gait & Posture Assessment  Ambulation: Unassisted Gait: Relatively normal for age and body habitus Posture: WNL   Lower Extremity Exam    Side: Right lower extremity  Side: Left lower extremity  Stability: No instability observed           Stability: No instability observed          Skin & Extremity Inspection: Skin color, temperature, and hair growth are WNL. No peripheral edema or cyanosis. No masses, redness, swelling, asymmetry, or associated skin lesions. No contractures.  Skin & Extremity Inspection: Skin color, temperature, and hair growth are WNL. No peripheral edema or cyanosis. No masses, redness, swelling, asymmetry, or associated skin lesions. No contractures.  Functional ROM: Unrestricted ROM                  Functional ROM: Unrestricted ROM                  Muscle Tone/Strength: Functionally intact. No obvious neuro-muscular anomalies detected.  Muscle Tone/Strength: Functionally intact. No obvious neuro-muscular anomalies detected.  Sensory (Neurological): Unimpaired  Sensory (Neurological): Unimpaired  Palpation: No palpable anomalies  Palpation: No palpable anomalies   Assessment  Primary Diagnosis & Pertinent Problem List: The primary encounter diagnosis was Thoracic radiculopathy (R-T11/T12). Diagnoses of Thoracic spinal stenosis (T11/T12), Cervical radiculopathy at C6 (LEFT), and Foraminal stenosis of cervical region were also pertinent to this visit.  Status Diagnosis  Persistent Persistent Stable 1. Thoracic radiculopathy (R-T11/T12)   2. Thoracic spinal stenosis (T11/T12)   3. Cervical radiculopathy at C6 (LEFT)   4. Foraminal stenosis of cervical region       42 year old male with a history of thoracic radiculopathy most pronounced at T11 and T12 affecting the T11 neuroforamen on the right.  Patient is status post right T12-L1 epidural steroid injection without any significant benefit.  We spent extended period of time reviewing the patient's MRI as well as images from the patient's thoracic epidural steroid injection.  Reviewing fluoroscopy images reveals appropriate contrast spread to cover the T11-T12 nerve roots.  Patient's pain he states occasionally worsens with coughing suggesting discogenic  pathology.  Patient is not interested in repeating thoracic epidural steroid injection.  In regard to the patient's cervical radicular pain, he states that his pain has improved although he still endorsing numbness in his left forearm and hand.  I did offer the patient a left cervical epidural steroid injection but he wants to hold off at this time.  I will prescribe the patient a Medrol Dosepak as below and we will also refer him to physical therapy.  Patient can follow-up with me as needed.  Plan of Care  Pharmacotherapy (Medications Ordered): Meds ordered this encounter  Medications  .  methylPREDNISolone (MEDROL) 4 MG TBPK tablet    Sig: Follow package instructions.    Dispense:  21 tablet    Refill:  0    Do not add to the "Automatic Refill" notification system.   Lab-work, procedure(s), and/or referral(s): Orders Placed This Encounter  Procedures  . Ambulatory referral to Physical Therapy   Time Note: Greater than 50% of the 25 minute(s) of face-to-face time spent with Alex Jenkins, was spent in counseling/coordination of care regarding: the treatment plan, treatment alternatives, the risks and possible complications of proposed treatment, medication side effects, the results, interpretation and significance of  his recent diagnostic interventional treatment(s), realistic expectations and the goals of pain management (increased in functionality).  Provider-requested follow-up: Return if symptoms worsen or fail to improve.  No future appointments.  Primary Care Physician: Derinda Late, MD Location: [facility] Note by: Gillis Santa, M.D Date: 10/03/2018; Time: 3:28 PM  There are no Patient Instructions on file for this visit.

## 2018-10-03 NOTE — Progress Notes (Signed)
Safety precautions to be maintained throughout the outpatient stay will include: orient to surroundings, keep bed in low position, maintain call bell within reach at all times, provide assistance with transfer out of bed and ambulation.  

## 2018-10-10 ENCOUNTER — Encounter: Payer: Self-pay | Admitting: Student in an Organized Health Care Education/Training Program

## 2019-12-08 IMAGING — MR MR THORACIC SPINE W/O CM
4 of 6 series · 18 of 48 positions shown · non-contrast
Comparison: Cervical spine MRI today reported separately.

CLINICAL DATA: 42-year-old male with onset of mid back and right
rib pain 8-10 months ago. New onset of left side neck pain radiating
to the left arm with numbness 3-4 weeks ago.

EXAM:
MRI THORACIC SPINE WITHOUT CONTRAST
TECHNIQUE: Multiplanar, multisequence MR imaging of the thoracic spine was
performed. No intravenous contrast was administered.

[Series 17: T1 · sagittal · 3.0mm · 0.78mm/px · 3 of 17 slices shown]
[im 4/17]
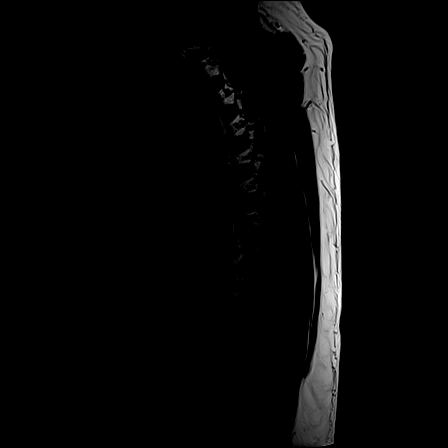
[im 10/17]
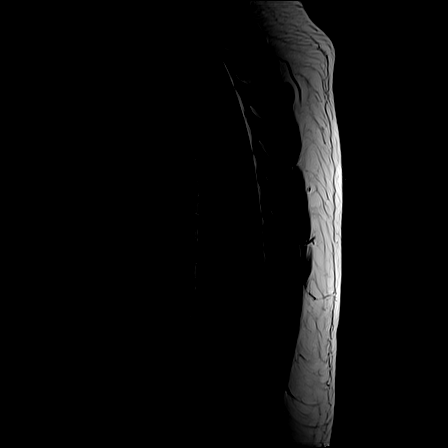
[im 17/17]
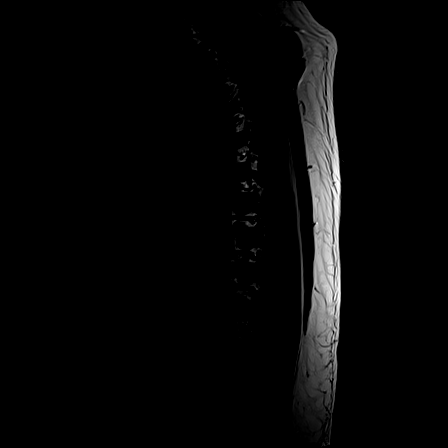

[Series 18: STIR · sagittal · 3.0mm · 0.78mm/px · 3 of 17 slices shown]
[im 4/17]
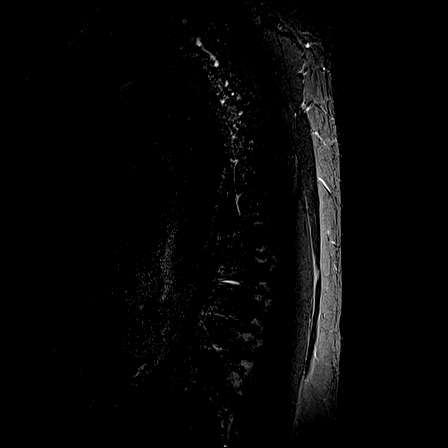
[im 10/17]
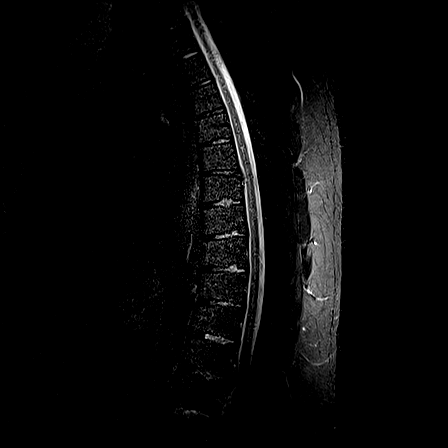
[im 17/17]
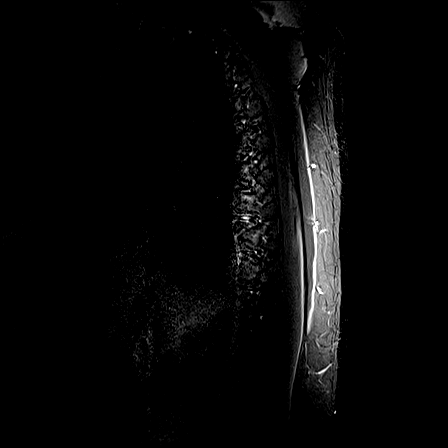

[Series 19: T2 · sagittal · 3.0mm · 0.78mm/px · 6 of 17 slices shown (1 of 2)]
[im 1/17]
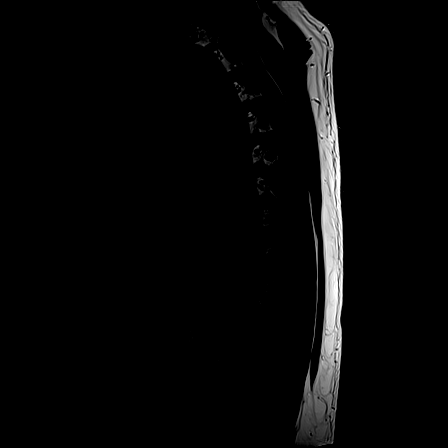
[im 4/17]
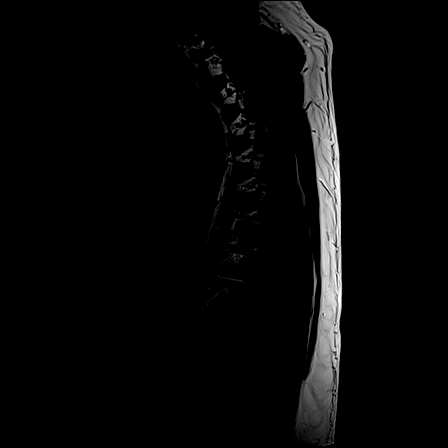
[im 7/17]
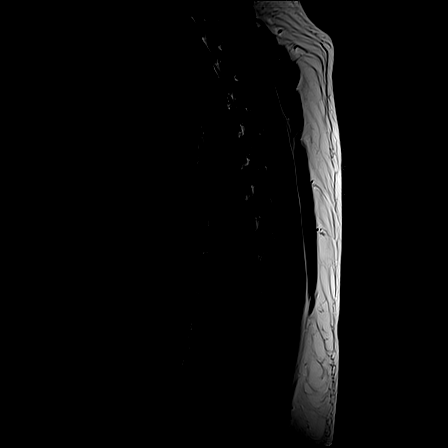
[im 10/17]
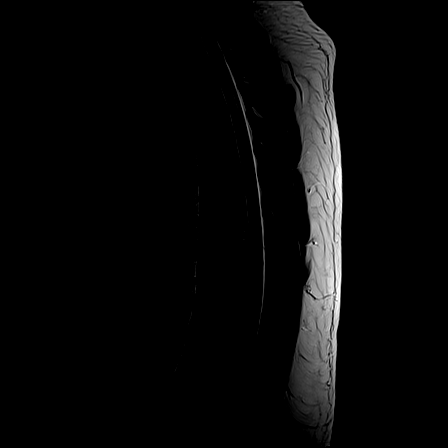
[im 13/17]
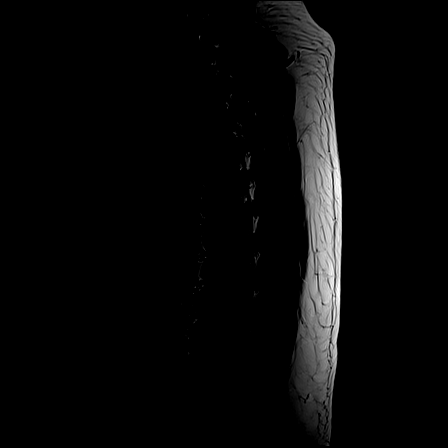
[im 17/17]
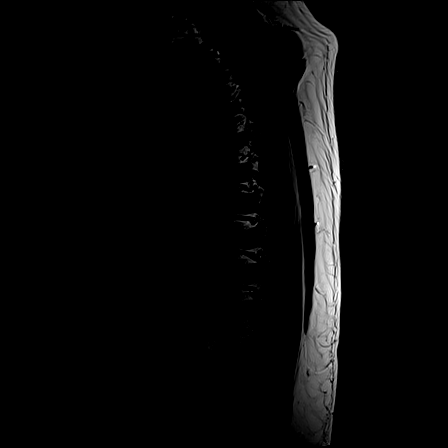

[Series 20: T2 · axial · 4.0mm · 0.28mm/px · z∈[-286,-94]mm · 6 of 33 slices shown (2 of 2)]
[im 1/33]
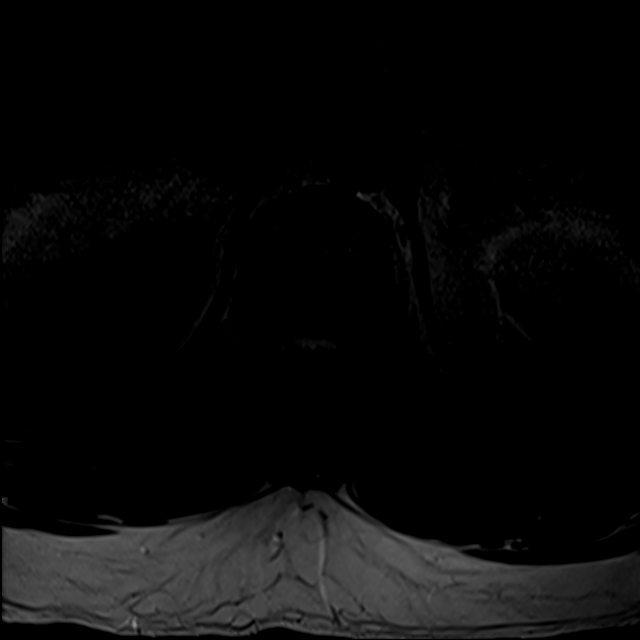
[im 6/33]
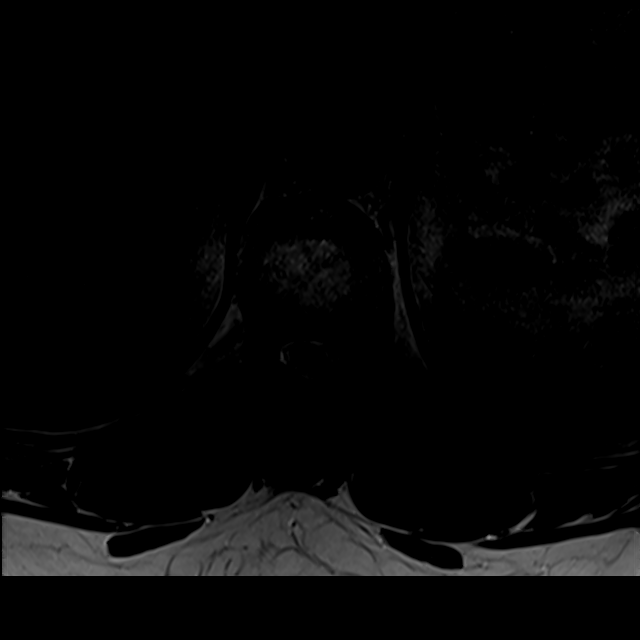
[im 9/33]
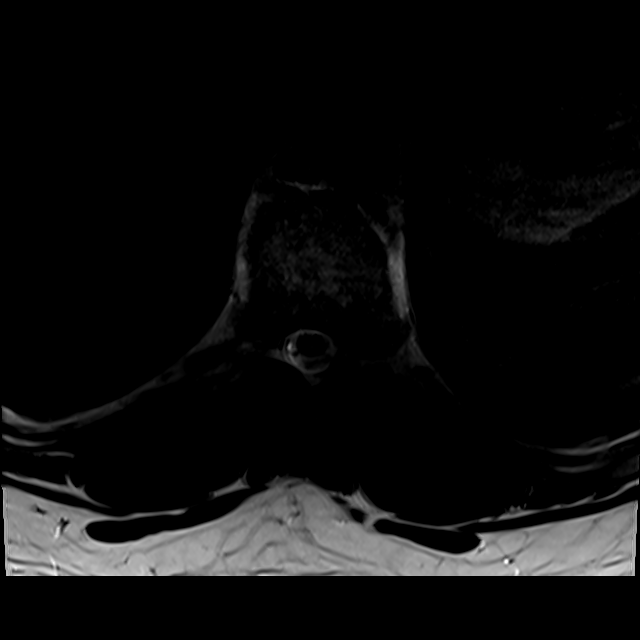
[im 15/33]
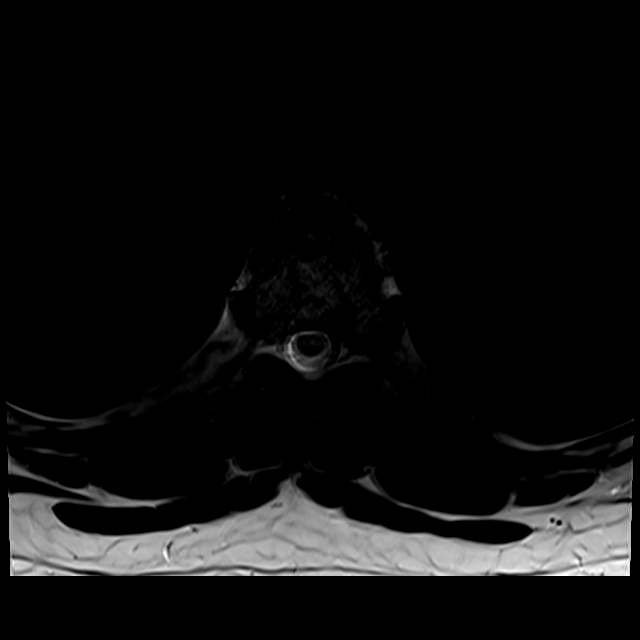
[im 18/33]
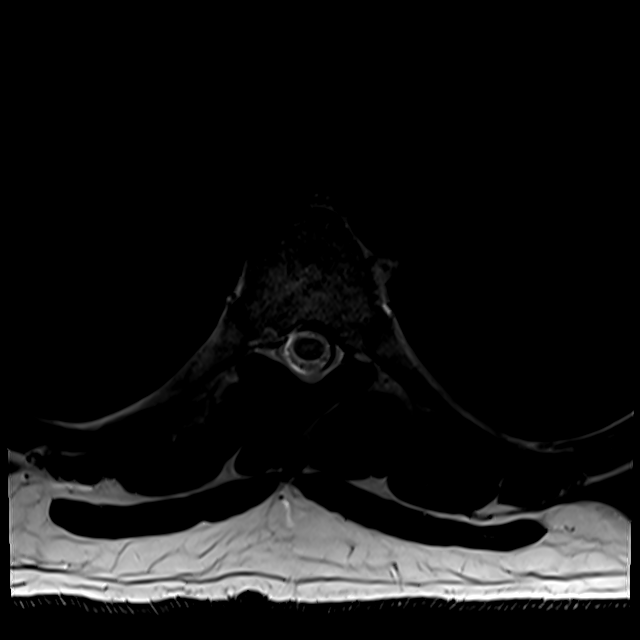
[im 30/33]
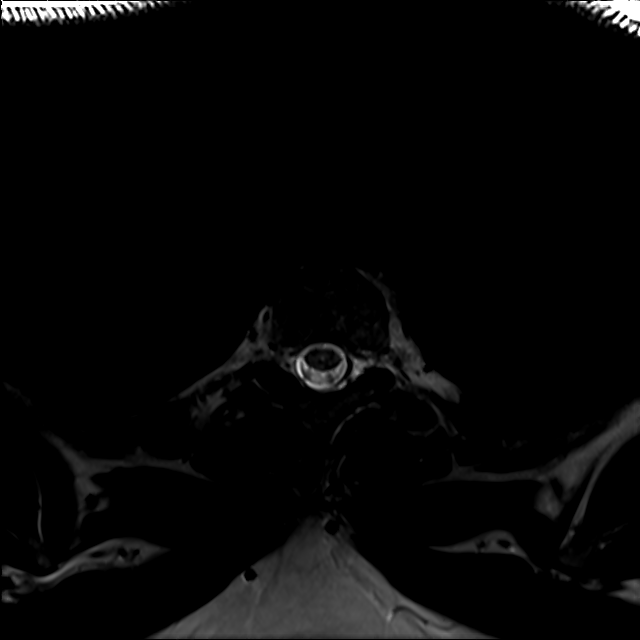

[18 of 48 positions shown; findings below may reference images not displayed]

FINDINGS: Limited cervical spine imaging:  Reported separately today.

Thoracic spine segmentation:  Appears normal.

Alignment:  Normal thoracic kyphosis.  No spondylolisthesis.

Vertebrae: Widespread mid and lower thoracic endplate Schmorl's
nodes, chronic. No marrow edema or evidence of acute osseous
abnormality. Background bone marrow signal appears within normal
limits.

Cord: Faint enlargement of the central spinal canal suspected in the
lower cervical and upper thoracic spine (series 20, image 3 at
T1-T2). Normal spinal cord signal and morphology above the T11
level. No definite cord signal abnormality in the lower thoracic
spine. The conus appears to be at T12.

Paraspinal and other soft tissues: Negative visible chest and upper
abdominal viscera.

Disc levels:

T1-T2: Negative.

T2-T3: Minimal central disc bulging.  No stenosis.

T3-T4: Negative.

T4-T5: Mild right T4 foraminal stenosis appears related to facet
hypertrophy.

T5-T6: Mild facet hypertrophy. Borderline to mild left T5 foraminal
stenosis.

T6-T7: Mild facet hypertrophy. Borderline to mild bilateral T6
foraminal stenosis.

T7-T8: Mild facet and ligament flavum hypertrophy.  No stenosis.

T8-T9: Mild facet and ligament flavum hypertrophy. Mild bilateral T8
foraminal stenosis.

T9-T10: Mild facet hypertrophy. Borderline to mild bilateral T9
foraminal stenosis.

T10-T11: Mild facet and ligament flavum hypertrophy. Mild to
moderate bilateral T10 foraminal stenosis.

T11-T12: Broad-based posterior disc bulge or protrusion resulting in
spinal stenosis with mild spinal cord mass effect (series 19, image
9 and series 20, image 31). Associated mild to moderate T11
foraminal stenosis greater on the right.

T12-L1: Negative.
IMPRESSION: 1. Positive for degenerative lower thoracic spinal stenosis at
T11-T12 with mild cord compression related to broad-based disc
bulging. No associated spinal cord signal abnormality. Mild to
moderate associated T11 foraminal stenosis.
2. Chronic endplate Schmorl's nodes but otherwise minimal thoracic
disc degeneration elsewhere. Thoracic facet and ligament flavum
hypertrophy resulting in intermittent mild thoracic neural foraminal
stenosis.

## 2020-12-16 ENCOUNTER — Other Ambulatory Visit: Payer: Self-pay

## 2020-12-16 ENCOUNTER — Ambulatory Visit: Payer: BC Managed Care – PPO | Admitting: Podiatry

## 2020-12-16 ENCOUNTER — Ambulatory Visit (INDEPENDENT_AMBULATORY_CARE_PROVIDER_SITE_OTHER): Payer: BC Managed Care – PPO

## 2020-12-16 DIAGNOSIS — M2141 Flat foot [pes planus] (acquired), right foot: Secondary | ICD-10-CM

## 2020-12-16 DIAGNOSIS — M2142 Flat foot [pes planus] (acquired), left foot: Secondary | ICD-10-CM

## 2020-12-16 DIAGNOSIS — M778 Other enthesopathies, not elsewhere classified: Secondary | ICD-10-CM | POA: Diagnosis not present

## 2020-12-16 NOTE — Progress Notes (Signed)
   Subjective:  45 y.o. male presenting today as a new patient for evaluation of bilateral foot pain has been going on for several years.  He states that he works up and down on his feet throughout the day and experiences significant foot pain.  He has been seeing a chiropractor to help relieve some of the pain and back pain.  He presents for further treatment evaluation   Past Medical History:  Diagnosis Date  . Hypertension        Objective/Physical Exam General: The patient is alert and oriented x3 in no acute distress.  Dermatology: Skin is warm, dry and supple bilateral lower extremities. Negative for open lesions or macerations.  Vascular: Palpable pedal pulses bilaterally. No edema or erythema noted. Capillary refill within normal limits.  Neurological: Epicritic and protective threshold grossly intact bilaterally.   Musculoskeletal Exam: Range of motion within normal limits to all pedal and ankle joints bilateral. Muscle strength 5/5 in all groups bilateral.  Upon weightbearing there is a medial longitudinal arch collapse bilaterally. Remove foot valgus noted to the bilateral lower extremities with excessive pronation upon mid stance.  Radiographic Exam:  Normal osseous mineralization. Joint spaces preserved. No fracture/dislocation/boney destruction.   Pes planus noted on radiographic exam lateral views. Decreased calcaneal inclination and metatarsal declination angle is noted. Anterior break in the cyma line noted on lateral views. Medial talar head to deviation noted on AP radiograph.   Assessment: 1. pes planus bilateral   Plan of Care:  1. Patient was evaluated. X-Rays reviewed.  2.  Appointment with Pedorthist for custom molded orthotics 3.  Recommend good supportive shoes and not going barefoot 4.  Return to clinic as needed   Edrick Kins, DPM [facility]  Dr. Edrick Kins, DPM    [address], [place] [address]                Office [phone]  Fax [phone]

## 2020-12-17 ENCOUNTER — Ambulatory Visit (INDEPENDENT_AMBULATORY_CARE_PROVIDER_SITE_OTHER): Payer: BC Managed Care – PPO | Admitting: Orthotics

## 2020-12-17 DIAGNOSIS — M2142 Flat foot [pes planus] (acquired), left foot: Secondary | ICD-10-CM

## 2020-12-17 DIAGNOSIS — M778 Other enthesopathies, not elsewhere classified: Secondary | ICD-10-CM

## 2020-12-17 DIAGNOSIS — M2141 Flat foot [pes planus] (acquired), right foot: Secondary | ICD-10-CM

## 2021-01-21 ENCOUNTER — Ambulatory Visit: Payer: BC Managed Care – PPO | Admitting: Orthotics

## 2021-01-21 ENCOUNTER — Other Ambulatory Visit: Payer: Self-pay

## 2021-01-21 DIAGNOSIS — M778 Other enthesopathies, not elsewhere classified: Secondary | ICD-10-CM

## 2021-01-21 DIAGNOSIS — M2141 Flat foot [pes planus] (acquired), right foot: Secondary | ICD-10-CM

## 2021-01-21 NOTE — Progress Notes (Signed)
Patient picked up f/o and was pleased with fit, comfort, and function.  Worked well with footwear.  Told of rbeak in period and how to report any issues.  

## 2021-03-11 ENCOUNTER — Telehealth: Payer: Self-pay | Admitting: Podiatry

## 2021-03-11 NOTE — Telephone Encounter (Signed)
Pt picked up orthotics in february and states that he has almost worn a hole in the forefoot.   I told him that is not normal and to drop them off at the office and I will get them sent back to be redone. He asked if it would be with the same material and I told pt I would ask the company to redo it with something more durable.I told pt it takes a few weeks to get them back.

## 2021-03-12 NOTE — Telephone Encounter (Signed)
Pt presented to office with orthotics and they will be sent to [facility] office for repair by Dr Amalia Hailey tomorrow.

## 2021-03-16 ENCOUNTER — Telehealth: Payer: Self-pay | Admitting: Podiatry

## 2021-03-16 NOTE — Telephone Encounter (Signed)
Talked with Cristie Hem at [facility] lab before calling pt. I wanted to make sure pt was aware we are changing his inserts because the material used is not upholding to pts weight and activities seeing how the toe is almost worn a hole in it in the 2 months patient has had them. Pt is ok with this and I told pt I would call when I get them in.

## 2021-03-27 NOTE — Telephone Encounter (Signed)
Alex Jenkins came by and picked up his orthotics.  He did not come into the office because he said he was sick.  Alex Jenkins delivered the orthotics to him at his car in the parking lot.  He signed the form stating he received the orthotics.

## 2021-06-10 NOTE — Progress Notes (Signed)
Patient in office today and seen by EJ with [facility] for custom orthotic measurements. Patient advised that the office will call when the orthotics are available for pick-up.

## 2021-08-05 ENCOUNTER — Other Ambulatory Visit: Payer: BC Managed Care – PPO

## 2021-08-19 ENCOUNTER — Ambulatory Visit (INDEPENDENT_AMBULATORY_CARE_PROVIDER_SITE_OTHER): Payer: BC Managed Care – PPO

## 2021-08-19 ENCOUNTER — Other Ambulatory Visit: Payer: Self-pay

## 2021-08-19 ENCOUNTER — Other Ambulatory Visit: Payer: BC Managed Care – PPO

## 2021-08-19 DIAGNOSIS — M2142 Flat foot [pes planus] (acquired), left foot: Secondary | ICD-10-CM

## 2021-08-19 DIAGNOSIS — M2141 Flat foot [pes planus] (acquired), right foot: Secondary | ICD-10-CM

## 2021-08-19 NOTE — Progress Notes (Signed)
Patient comes in with pain in left lateral side foot pain from orthotic. He states "it feels like a ball on the side of his foot"   A custom thin lift was placed under orthotic to raise latera side of orthotic up.  Patient states "it feels better already with the lift"  Patient will call if any other trouble arises

## 2021-12-18 ENCOUNTER — Telehealth: Payer: Self-pay | Admitting: Podiatry

## 2021-12-18 NOTE — Telephone Encounter (Signed)
Pt called and left message stating he called his insurance to see if he could get a new pair of orthotics and they said he did qualify for them.  I called pt back and explained that he would need an appt with the doctor for a new rx if we are billing the insurance. He does not need to see the pedorthist if ordering the same ones as last yr. He is scheduled for 1.24.2023 in [place] office.

## 2021-12-29 ENCOUNTER — Other Ambulatory Visit: Payer: Self-pay

## 2021-12-29 ENCOUNTER — Ambulatory Visit: Payer: BC Managed Care – PPO | Admitting: Podiatry

## 2021-12-29 ENCOUNTER — Encounter: Payer: Self-pay | Admitting: Podiatry

## 2021-12-29 DIAGNOSIS — M2142 Flat foot [pes planus] (acquired), left foot: Secondary | ICD-10-CM | POA: Diagnosis not present

## 2021-12-29 DIAGNOSIS — M2141 Flat foot [pes planus] (acquired), right foot: Secondary | ICD-10-CM

## 2021-12-29 NOTE — Progress Notes (Signed)
° °  Subjective:  47 y.o. male presenting today for follow-up evaluation of bilateral flatfoot deformity for several years.  Patient was last seen in the office approximately 1 year ago and orthotics were made for the patient.  He has been in a few different times throughout the year to have them adjusted.  He presents today to have new orthotics molded and made.  He says that overall the orthotics have helped with some of his foot pain.  He presents for further treatment and evaluation  Past Medical History:  Diagnosis Date   Hypertension    Past Surgical History:  Procedure Laterality Date   CHOLECYSTECTOMY     Allergies  Allergen Reactions   Omeprazole Other (See Comments)       Objective/Physical Exam General: The patient is alert and oriented x3 in no acute distress.  Dermatology: Skin is warm, dry and supple bilateral lower extremities. Negative for open lesions or macerations.  Hyperkeratotic callus tissue noted to the plantar aspect of the IPJ bilateral great toes.  The patient puts a lot of pressure here during weightbearing  Vascular: Palpable pedal pulses bilaterally. No edema or erythema noted. Capillary refill within normal limits.  Neurological: Epicritic and protective threshold grossly intact bilaterally.   Musculoskeletal Exam: Range of motion within normal limits to all pedal and ankle joints bilateral. Muscle strength 5/5 in all groups bilateral.  Upon weightbearing there is a medial longitudinal arch collapse bilaterally. Remove foot valgus noted to the bilateral lower extremities with excessive pronation upon mid stance.  Assessment: 1. pes planus bilateral 2.  Symptomatic callus plantar aspect of the great toes bilateral   Plan of Care:  1. Patient was evaluated.  2.  Appointment with Pedorthist for custom molded orthotics 3.  Recommend good supportive shoes and not going barefoot 4.  Return to clinic annually  *Works at [employer] since 1999   Edrick Kins, DPM [facility]  Dr. Edrick Kins, DPM    [address], [place] [address]                Office [phone]  Fax [phone]

## 2022-01-15 ENCOUNTER — Ambulatory Visit (INDEPENDENT_AMBULATORY_CARE_PROVIDER_SITE_OTHER): Payer: BC Managed Care – PPO

## 2022-01-15 ENCOUNTER — Other Ambulatory Visit: Payer: Self-pay

## 2022-01-15 DIAGNOSIS — M2142 Flat foot [pes planus] (acquired), left foot: Secondary | ICD-10-CM | POA: Diagnosis not present

## 2022-01-15 DIAGNOSIS — M2141 Flat foot [pes planus] (acquired), right foot: Secondary | ICD-10-CM

## 2022-01-15 NOTE — Progress Notes (Signed)
SITUATION Reason for Consult: Evaluation for Bilateral Custom Foot Orthoses Patient / Caregiver Report: Patient is ready for foot orthotics  OBJECTIVE DATA: Patient History / Diagnosis:    ICD-10-CM   1. Pes planus of both feet  M21.41    M21.42       Current or Previous Devices: Historical FO user  Foot Examination: Skin presentation:   Intact Ulcers & Callousing:   None and no history Toe / Foot Deformities:  Pes planus, claw toe hallux bilateral, preulcerative callus Weight Bearing Presentation:  Planus Sensation:    Intact  ORTHOTIC RECOMMENDATION Recommended Device: 1x pair of custom functional foot orthotics  GOALS OF ORTHOSES - Reduce Pain - Prevent Foot Deformity - Prevent Progression of Further Foot Deformity - Relieve Pressure - Improve the Overall Biomechanical Function of the Foot and Lower Extremity.  ACTIONS PERFORMED Patient was casted for Foot Orthoses via crush box. Procedure was explained and patient tolerated procedure well. All questions were answered and concerns addressed.  PLAN Potential out of pocket cost was communicated to patient. Casts are to be sent to [facility] for fabrication. Patient is to be called for fitting when devices are ready.

## 2022-02-09 ENCOUNTER — Telehealth: Payer: Self-pay

## 2022-02-09 NOTE — Telephone Encounter (Signed)
Casts sent to central fabrication °

## 2022-03-15 ENCOUNTER — Ambulatory Visit: Payer: BC Managed Care – PPO

## 2022-03-15 DIAGNOSIS — M778 Other enthesopathies, not elsewhere classified: Secondary | ICD-10-CM

## 2022-03-15 DIAGNOSIS — M2141 Flat foot [pes planus] (acquired), right foot: Secondary | ICD-10-CM

## 2022-03-15 NOTE — Progress Notes (Signed)
SITUATION: ?Reason for Visit: Fitting and Delivery of Custom Fabricated Foot Orthoses ?Patient Report: Patient reports comfort and is satisfied with device. ? ?OBJECTIVE DATA: ?Patient History / Diagnosis:   ?  ICD-10-CM   ?1. Pes planus of both feet  M21.41   ? M21.42   ?  ?2. Capsulitis of foot, unspecified laterality  M77.8   ?  ? ? ?Provided Device:  Custom Functional Foot Orthotics ?    [facility]: [id number] ? ?GOAL OF ORTHOSIS ?- Improve gait ?- Decrease energy expenditure ?- Improve Balance ?- Provide Triplanar stability of foot complex ?- Facilitate motion ? ?ACTIONS PERFORMED ?Patient was fit with foot orthotics trimmed to shoe last. Patient tolerated fittign procedure.  ? ?Patient was provided with verbal and written instruction and demonstration regarding donning, doffing, wear, care, proper fit, function, purpose, cleaning, and use of the orthosis and in all related precautions and risks and benefits regarding the orthosis. ? ?Patient was also provided with verbal instruction regarding how to report any failures or malfunctions of the orthosis and necessary follow up care. Patient was also instructed to contact our office regarding any change in status that may affect the function of the orthosis. ? ?Patient demonstrated independence with proper donning, doffing, and fit and verbalized understanding of all instructions. ? ?PLAN: ?Patient is to follow up in one week or as necessary (PRN). All questions were answered and concerns addressed. Plan of care was discussed with and agreed upon by the patient. ? ?

## 2022-04-19 ENCOUNTER — Telehealth: Payer: Self-pay

## 2022-04-19 NOTE — Telephone Encounter (Signed)
Spoke with pt to inform him that his orthotics have come in. He stated that he would like to pick them up and we will exchange them out for the previous pair that he was given that did not work for him. Pt is going to call back if these need any adjustments.

## 2022-10-19 ENCOUNTER — Telehealth: Payer: Self-pay | Admitting: Podiatry

## 2022-10-19 NOTE — Telephone Encounter (Signed)
Pt calling to see if his orthotics are in. They were ordered about a month ago. Pt states he left message last week and no one returned his call.

## 2022-10-19 NOTE — Telephone Encounter (Signed)
Spoke with pt and informed him that his inserts were shipped out from [facility] this morning.

## 2022-10-22 ENCOUNTER — Other Ambulatory Visit: Payer: Self-pay | Admitting: Podiatry

## 2022-10-22 ENCOUNTER — Telehealth: Payer: Self-pay

## 2022-10-22 ENCOUNTER — Other Ambulatory Visit: Payer: Managed Care, Other (non HMO)

## 2022-10-22 MED ORDER — MELOXICAM 15 MG PO TABS: 15.0000 mg | ORAL_TABLET | Freq: Every day | ORAL | 1 refills | Status: DC

## 2022-10-22 NOTE — Telephone Encounter (Signed)
Rx sent. - Dr. Amalia Hailey

## 2022-10-25 NOTE — Telephone Encounter (Signed)
Noted, thanks!

## 2022-12-08 ENCOUNTER — Ambulatory Visit (INDEPENDENT_AMBULATORY_CARE_PROVIDER_SITE_OTHER): Payer: Managed Care, Other (non HMO)

## 2022-12-08 ENCOUNTER — Ambulatory Visit: Payer: Managed Care, Other (non HMO) | Admitting: Podiatry

## 2022-12-08 VITALS — BP 149/90 | HR 77

## 2022-12-08 DIAGNOSIS — M62462 Contracture of muscle, left lower leg: Secondary | ICD-10-CM

## 2022-12-08 DIAGNOSIS — M2142 Flat foot [pes planus] (acquired), left foot: Secondary | ICD-10-CM

## 2022-12-08 DIAGNOSIS — M2141 Flat foot [pes planus] (acquired), right foot: Secondary | ICD-10-CM | POA: Diagnosis not present

## 2022-12-08 DIAGNOSIS — M722 Plantar fascial fibromatosis: Secondary | ICD-10-CM | POA: Diagnosis not present

## 2022-12-08 MED ORDER — DICLOFENAC SODIUM 75 MG PO TBEC: 75.0000 mg | DELAYED_RELEASE_TABLET | Freq: Two times a day (BID) | ORAL | 2 refills | Status: AC

## 2022-12-08 MED ORDER — METHYLPREDNISOLONE 4 MG PO TBPK: ORAL_TABLET | ORAL | 0 refills | Status: AC

## 2022-12-08 NOTE — Progress Notes (Addendum)
  Subjective:  Patient ID: Alex Jenkins, male    DOB: 07/06/1976,  MRN: [id number]  Chief Complaint  Patient presents with   Tendonitis    est - left foot heel pain - bottom of heel and back of heel - gets worse as the day goes on - he has had plantar fasciitis in the past and this is different     47 y.o. male presents with the above complaint. History confirmed with patient.  Gets worse throughout the day as he goes on.  He has concrete floors at work.  Does not wear shoes at home.  Objective:  Physical Exam: warm, good capillary refill, no trophic changes or ulcerative lesions, normal DP and PT pulses, and normal sensory exam. Left Foot: point tenderness over the heel pad and gastrocnemius equinus is noted with a positive silverskiold test   Radiographs: Multiple views x-ray of the left foot: no fracture, dislocation, swelling or degenerative changes noted, plantar calcaneal spur, posterior calcaneal spur, and pes planus deformity Assessment:   1. Plantar fasciitis of left foot   2. Pes planus of both feet   3. Gastrocnemius equinus of left lower extremity      Plan:  Patient was evaluated and treated and all questions answered.  Discussed the etiology and treatment options for plantar fasciitis including stretching, formal physical therapy, supportive shoegears such as a running shoe or sneaker, pre fabricated orthoses, injection therapy, and oral medications. We also discussed the role of surgical treatment of this for patients who do not improve after exhausting non-surgical treatment options.   -XR reviewed with patient -Educated patient on stretching and icing of the affected limb -Night splint: he will get on [employer] -Injection delivered to the plantar fascia of the left foot. -Rx for medrol pack. Educated on use, risks, and benefits of the medication -Rx for diclofenac 75 mg p.o. twice daily sent to pharmacy to begin after methylprednisolone taper -Recommended using  a Tuli's heel cup for additional cushioning on the heel. -We discussed the presence of the heel spur and hopefully can avoid possible resection of this -He will let me know if it does not improve within 5 to 6 weeks -We discussed the significant equinus contracture of his tendon which is contributing to this worsening, hopefully night splint can alleviate this  After sterile prep with povidone-iodine solution and alcohol, the left heel was injected with 0.5cc 2% xylocaine plain, 0.5cc 0.5% marcaine plain, '10mg'$  triamcinolone acetonide, and '2mg'$  dexamethasone was injected along the medial plantar fascia at the insertion on the plantar calcaneus. The patient tolerated the procedure well without complication.  Return if symptoms worsen or fail to improve.

## 2022-12-08 NOTE — Patient Instructions (Addendum)
Look for a Tuli's heel cup on [place] and wear while working     I would recommend not going barefoot at home until this gets better    Plantar Fasciitis (Heel Spur Syndrome) with Rehab The plantar fascia is a fibrous, ligament-like, soft-tissue structure that spans the bottom of the foot. Plantar fasciitis is a condition that causes pain in the foot due to inflammation of the tissue. SYMPTOMS  Pain and tenderness on the underneath side of the foot. Pain that worsens with standing or walking. CAUSES  Plantar fasciitis is caused by irritation and injury to the plantar fascia on the underneath side of the foot. Common mechanisms of injury include: Direct trauma to bottom of the foot. Damage to a small nerve that runs under the foot where the main fascia attaches to the heel bone. Stress placed on the plantar fascia due to bone spurs. RISK INCREASES WITH:  Activities that place stress on the plantar fascia (running, jumping, pivoting, or cutting). Poor strength and flexibility. Improperly fitted shoes. Tight calf muscles. Flat feet. Failure to warm-up properly before activity. Obesity. PREVENTION Warm up and stretch properly before activity. Allow for adequate recovery between workouts. Maintain physical fitness: Strength, flexibility, and endurance. Cardiovascular fitness. Maintain a health body weight. Avoid stress on the plantar fascia. Wear properly fitted shoes, including arch supports for individuals who have flat feet.  PROGNOSIS  If treated properly, then the symptoms of plantar fasciitis usually resolve without surgery. However, occasionally surgery is necessary.  RELATED COMPLICATIONS  Recurrent symptoms that may result in a chronic condition. Problems of the lower back that are caused by compensating for the injury, such as limping. Pain or weakness of the foot during push-off following surgery. Chronic inflammation, scarring, and partial or complete fascia tear,  occurring more often from repeated injections.  TREATMENT  Treatment initially involves the use of ice and medication to help reduce pain and inflammation. The use of strengthening and stretching exercises may help reduce pain with activity, especially stretches of the Achilles tendon. These exercises may be performed at home or with a therapist. Your caregiver may recommend that you use heel cups of arch supports to help reduce stress on the plantar fascia. Occasionally, corticosteroid injections are given to reduce inflammation. If symptoms persist for greater than 6 months despite non-surgical (conservative), then surgery may be recommended.   MEDICATION  If pain medication is necessary, then nonsteroidal anti-inflammatory medications, such as aspirin and ibuprofen, or other minor pain relievers, such as acetaminophen, are often recommended. Do not take pain medication within 7 days before surgery. Prescription pain relievers may be given if deemed necessary by your caregiver. Use only as directed and only as much as you need. Corticosteroid injections may be given by your caregiver. These injections should be reserved for the most serious cases, because they may only be given a certain number of times.  HEAT AND COLD Cold treatment (icing) relieves pain and reduces inflammation. Cold treatment should be applied for 10 to 15 minutes every 2 to 3 hours for inflammation and pain and immediately after any activity that aggravates your symptoms. Use ice packs or massage the area with a piece of ice (ice massage). Heat treatment may be used prior to performing the stretching and strengthening activities prescribed by your caregiver, physical therapist, or athletic trainer. Use a heat pack or soak the injury in warm water.  SEEK IMMEDIATE MEDICAL CARE IF: Treatment seems to offer no benefit, or the condition worsens. Any medications produce  adverse side effects.  EXERCISES- RANGE OF MOTION (ROM) AND  STRETCHING EXERCISES - Plantar Fasciitis (Heel Spur Syndrome) These exercises may help you when beginning to rehabilitate your injury. Your symptoms may resolve with or without further involvement from your physician, physical therapist or athletic trainer. While completing these exercises, remember:  Restoring tissue flexibility helps normal motion to return to the joints. This allows healthier, less painful movement and activity. An effective stretch should be held for at least 30 seconds. A stretch should never be painful. You should only feel a gentle lengthening or release in the stretched tissue.  RANGE OF MOTION - Toe Extension, Flexion Sit with your right / left leg crossed over your opposite knee. Grasp your toes and gently pull them back toward the top of your foot. You should feel a stretch on the bottom of your toes and/or foot. Hold this stretch for 10 seconds. Now, gently pull your toes toward the bottom of your foot. You should feel a stretch on the top of your toes and or foot. Hold this stretch for 10 seconds. Repeat  times. Complete this stretch 3 times per day.   RANGE OF MOTION - Ankle Dorsiflexion, Active Assisted Remove shoes and sit on a chair that is preferably not on a carpeted surface. Place right / left foot under knee. Extend your opposite leg for support. Keeping your heel down, slide your right / left foot back toward the chair until you feel a stretch at your ankle or calf. If you do not feel a stretch, slide your bottom forward to the edge of the chair, while still keeping your heel down. Hold this stretch for 10 seconds. Repeat 3 times. Complete this stretch 2 times per day.   STRETCH  Gastroc, Standing Place hands on wall. Extend right / left leg, keeping the front knee somewhat bent. Slightly point your toes inward on your back foot. Keeping your right / left heel on the floor and your knee straight, shift your weight toward the wall, not allowing your back  to arch. You should feel a gentle stretch in the right / left calf. Hold this position for 10 seconds. Repeat 3 times. Complete this stretch 2 times per day.  STRETCH  Soleus, Standing Place hands on wall. Extend right / left leg, keeping the other knee somewhat bent. Slightly point your toes inward on your back foot. Keep your right / left heel on the floor, bend your back knee, and slightly shift your weight over the back leg so that you feel a gentle stretch deep in your back calf. Hold this position for 10 seconds. Repeat 3 times. Complete this stretch 2 times per day.  STRETCH  Gastrocsoleus, Standing  Note: This exercise can place a lot of stress on your foot and ankle. Please complete this exercise only if specifically instructed by your caregiver.  Place the ball of your right / left foot on a step, keeping your other foot firmly on the same step. Hold on to the wall or a rail for balance. Slowly lift your other foot, allowing your body weight to press your heel down over the edge of the step. You should feel a stretch in your right / left calf. Hold this position for 10 seconds. Repeat this exercise with a slight bend in your right / left knee. Repeat 3 times. Complete this stretch 2 times per day.   STRENGTHENING EXERCISES - Plantar Fasciitis (Heel Spur Syndrome)  These exercises may help you when  beginning to rehabilitate your injury. They may resolve your symptoms with or without further involvement from your physician, physical therapist or athletic trainer. While completing these exercises, remember:  Muscles can gain both the endurance and the strength needed for everyday activities through controlled exercises. Complete these exercises as instructed by your physician, physical therapist or athletic trainer. Progress the resistance and repetitions only as guided.  STRENGTH - Towel Curls Sit in a chair positioned on a non-carpeted surface. Place your foot on a towel,  keeping your heel on the floor. Pull the towel toward your heel by only curling your toes. Keep your heel on the floor. Repeat 3 times. Complete this exercise 2 times per day.  STRENGTH - Ankle Inversion Secure one end of a rubber exercise band/tubing to a fixed object (table, pole). Loop the other end around your foot just before your toes. Place your fists between your knees. This will focus your strengthening at your ankle. Slowly, pull your big toe up and in, making sure the band/tubing is positioned to resist the entire motion. Hold this position for 10 seconds. Have your muscles resist the band/tubing as it slowly pulls your foot back to the starting position. Repeat 3 times. Complete this exercises 2 times per day.  Document Released: 11/22/2005 Document Revised: 02/14/2012 Document Reviewed: 03/06/2009 [facility] Patient Information 2014 [place], [place].

## 2022-12-10 ENCOUNTER — Ambulatory Visit: Payer: Managed Care, Other (non HMO) | Admitting: Podiatry

## 2023-03-13 ENCOUNTER — Other Ambulatory Visit: Payer: Self-pay | Admitting: Podiatry

## 2023-07-11 ENCOUNTER — Encounter: Payer: Self-pay | Admitting: Podiatry

## 2023-07-11 ENCOUNTER — Telehealth: Payer: Self-pay | Admitting: Podiatry

## 2023-07-11 NOTE — Telephone Encounter (Signed)
Patient would like a 2nd pair of orthotics, he does not want to be re-measured, Alex Jenkins said they get it wrong all the time. Alex Jenkins was the only one that did my orthotics right. Please contact patient to let him know rather or not this can be done.

## 2023-08-12 ENCOUNTER — Ambulatory Visit: Payer: Managed Care, Other (non HMO)

## 2023-08-12 ENCOUNTER — Encounter: Payer: Self-pay | Admitting: Podiatry

## 2023-08-12 DIAGNOSIS — M2142 Flat foot [pes planus] (acquired), left foot: Secondary | ICD-10-CM

## 2023-08-12 DIAGNOSIS — M2141 Flat foot [pes planus] (acquired), right foot: Secondary | ICD-10-CM | POA: Diagnosis not present

## 2023-08-12 DIAGNOSIS — M216X1 Other acquired deformities of right foot: Secondary | ICD-10-CM

## 2023-08-12 DIAGNOSIS — M216X2 Other acquired deformities of left foot: Secondary | ICD-10-CM

## 2023-08-12 DIAGNOSIS — M722 Plantar fascial fibromatosis: Secondary | ICD-10-CM

## 2023-08-12 DIAGNOSIS — M62462 Contracture of muscle, left lower leg: Secondary | ICD-10-CM

## 2023-08-12 NOTE — Progress Notes (Signed)
Patient was present and scanned for new CFO's patient brought previous pair with him and are [facility] base he really likes these and will try to build the same  Patient has Pes planus Deformity BIL  New inserts needed to increase contact to MLA's helping to balance body weight more evenly across BIL feet that will decrease plantar pressure and pain   Scanned today financial form signed  Patient will return when in for fitting   Alex Jenkins Cped, CFo, CFm

## 2023-08-16 MED ORDER — MEDICAL COMPRESSION STOCKINGS MISC: 2 refills | Status: AC

## 2023-08-22 NOTE — Progress Notes (Signed)
DX's updated  Alex Jenkins Cped, CFo, CFm

## 2023-09-02 NOTE — Progress Notes (Signed)
Orthotics came in patient was upset he needed an appt to be fit  He said if I can sent them back then please do so will call [facility] for RA  Addison Bailey CPed, CFo, CFm

## 2024-02-15 ENCOUNTER — Other Ambulatory Visit: Payer: Self-pay | Admitting: Orthopedic Surgery

## 2024-02-15 DIAGNOSIS — G8929 Other chronic pain: Secondary | ICD-10-CM

## 2024-02-15 DIAGNOSIS — M5417 Radiculopathy, lumbosacral region: Secondary | ICD-10-CM

## 2024-02-15 DIAGNOSIS — M51362 Other intervertebral disc degeneration, lumbar region with discogenic back pain and lower extremity pain: Secondary | ICD-10-CM

## 2024-03-18 ENCOUNTER — Encounter: Payer: Self-pay | Admitting: Emergency Medicine

## 2024-03-18 ENCOUNTER — Other Ambulatory Visit: Payer: Self-pay

## 2024-03-18 ENCOUNTER — Ambulatory Visit
Admission: EM | Admit: 2024-03-18 | Discharge: 2024-03-18 | Disposition: A | Discharge status: Home / Self Care | Attending: Emergency Medicine | Admitting: Emergency Medicine

## 2024-03-18 DIAGNOSIS — U071 COVID-19: Secondary | ICD-10-CM

## 2024-03-18 LAB — POC SARS CORONAVIRUS 2 AG -  ED: SARS Coronavirus 2 Ag: POSITIVE — AB

## 2024-03-18 MED ORDER — PREDNISONE 20 MG PO TABS: 40.0000 mg | ORAL_TABLET | Freq: Every day | ORAL | 0 refills | Status: AC

## 2024-03-18 MED ORDER — MOXIFLOXACIN HCL 0.5 % OP SOLN: 1.0000 [drp] | Freq: Three times a day (TID) | OPHTHALMIC | 0 refills | Status: AC

## 2024-03-18 NOTE — ED Provider Notes (Signed)
 Alex Jenkins    CSN: [id number] Arrival date & time: 03/18/24  0801      History   Chief Complaint Chief Complaint  Patient presents with   URI    HPI Alex Jenkins is a 48 y.o. male.   Patient presents for evaluation of a scratchy throat beginning 1 day ago, nasal congestion, nonproductive cough and fever peaking at 100.2 beginning overnight.  Noticed drainage and erythema to the left eye started overnight.  Known sick contacts.  Has not attempted treatment.  Denies shortness of breath or wheezing.  Home COVID test positive but expired, requesting retest.  Past Medical History:  Diagnosis Date   Hypertension     Patient Active Problem List   Diagnosis Date Noted   Thoracic radiculopathy (R-T11/T12) 09/13/2018   Thoracic spinal stenosis (T11/T12) 09/13/2018   Cervical radiculopathy at C6 (LEFT) 09/13/2018   Foraminal stenosis of cervical region 09/13/2018   Anxiety about health 05/25/2018   Obesity, Class III, BMI 40-49.9 (morbid obesity) (HCC) 05/25/2018   Benign essential hypertension 06/11/2014   Esophageal reflux 06/11/2014   Fatty liver 06/11/2014   Other B-complex deficiencies 06/11/2014    Past Surgical History:  Procedure Laterality Date   CHOLECYSTECTOMY         Home Medications    Prior to Admission medications   Medication Sig Start Date End Date Taking? Authorizing Provider  moxifloxacin (VIGAMOX) 0.5 % ophthalmic solution Place 1 drop into the left eye 3 (three) times daily. 03/18/24  Yes Shyvonne Chastang R, NP  predniSONE (DELTASONE) 20 MG tablet Take 2 tablets (40 mg total) by mouth daily. 03/18/24  Yes Jenniffer Vessels, Maybelle Spatz, NP  [facility] VITAMIN B12 1000 MCG tablet Take 1,000 mcg by mouth daily. 09/08/20   [provider]  diclofenac (VOLTAREN) 75 MG EC tablet Take 1 tablet (75 mg total) by mouth 2 (two) times daily. 12/15/22   Floyce Hutching, DPM  Elastic Bandages & Supports (MEDICAL COMPRESSION STOCKINGS) MISC Dispense 20-40mmHg knee  high compression stocking 08/16/23   McDonald, Adam R, DPM  fluticasone (FLONASE) 50 MCG/ACT nasal spray Place into both nostrils daily.    [provider]  fluticasone (FLONASE) 50 MCG/ACT nasal spray Place 2 sprays into both nostrils 2 (two) times daily. 12/15/20   [provider]  KRILL OIL PO Take by mouth.    [provider]  losartan (COZAAR) 50 MG tablet     [provider]  methylPREDNISolone (MEDROL DOSEPAK) 4 MG TBPK tablet 6 day dose pack - take as directed 12/08/22   McDonald, Adam R, DPM  Multiple Vitamin (MULTIVITAMIN) tablet Take 1 tablet by mouth daily.    [provider]  pantoprazole (PROTONIX) 40 MG tablet Take by mouth. 12/04/21   [provider]    Family History History reviewed. No pertinent family history.  Social History Social History   Tobacco Use   Smoking status: Never   Smokeless tobacco: Never  Vaping Use   Vaping status: Never Used  Substance Use Topics   Alcohol use: No   Drug use: No     Allergies   Omeprazole   Review of Systems Review of Systems   Physical Exam Triage Vital Signs ED Triage Vitals [03/18/24 0813]  Encounter Vitals Group     BP (!) 133/96     Systolic BP Percentile      Diastolic BP Percentile      Pulse Rate (!) 109     Resp 18  Temp 100.2 F (37.9 C)     Temp Source Oral     SpO2 97 %     Weight      Height      Head Circumference      Peak Flow      Pain Score 0     Pain Loc      Pain Education      Exclude from Growth Chart    No data found.  Updated Vital Signs BP (!) 133/96 (BP Location: Left Arm)   Pulse (!) 109   Temp 100.2 F (37.9 C) (Oral)   Resp 18   SpO2 97%   Visual Acuity Right Eye Distance:   Left Eye Distance:   Bilateral Distance:    Right Eye Near:   Left Eye Near:    Bilateral Near:     Physical Exam Constitutional:      Appearance: Normal appearance.  HENT:     Head: Normocephalic.     Right Ear: Tympanic  membrane, ear canal and external ear normal.     Left Ear: Tympanic membrane, ear canal and external ear normal.     Nose: Congestion present.     Mouth/Throat:     Mouth: Mucous membranes are moist.     Pharynx: Oropharynx is clear. Posterior oropharyngeal erythema present. No oropharyngeal exudate.  Eyes:     Extraocular Movements: Extraocular movements intact.     Comments: Erythema to the left conjunctiva, no drainage noted at this time, vision grossly intact, extraocular movements intact  Cardiovascular:     Rate and Rhythm: Normal rate and regular rhythm.     Pulses: Normal pulses.     Heart sounds: Normal heart sounds.  Pulmonary:     Effort: Pulmonary effort is normal.     Breath sounds: Normal breath sounds.  Musculoskeletal:     Cervical back: Normal range of motion and neck supple.  Neurological:     Mental Status: He is alert and oriented to person, place, and time. Mental status is at baseline.      UC Treatments / Results  Labs (all labs ordered are listed, but only abnormal results are displayed) Labs Reviewed  POC SARS CORONAVIRUS 2 AG -  ED - Abnormal; Notable for the following components:      Result Value   SARS Coronavirus 2 Ag Positive (*)    All other components within normal limits    EKG   Radiology No results found.  Procedures Procedures (including critical care time)  Medications Ordered in UC Medications - No data to display  Initial Impression / Assessment and Plan / UC Course  I have reviewed the triage vital signs and the nursing notes.  Pertinent labs & imaging results that were available during my care of the patient were reviewed by me and considered in my medical decision making (see chart for details).  COVID-19  Low-grade fever 100.2 with associated tachycardia noted in triage, patient in no signs of distress nontoxic-appearing, stable for outpatient management, rapid COVID test positive, discussed findings, deferring use of  Paxlovid as he is a young healthy adult with minimal comorbidity, prescribed prednisone as he is endorsing centralized chest discomfort reproducible on exam, recommended supportive care, prescribed moxifloxacin for coverage of conjunctivitis, recommended additional supportive care and advised follow-up as needed Final Clinical Impressions(s) / UC Diagnoses   Final diagnoses:  COVID-19     Discharge Instructions      Covid 19 is a virus and  should steadily improve in time it can take up to 7 to 10 days before you truly start to see a turnaround however things will get better  Begin prednisone every morning with food for 5 days to help with discomfort to the chest and may also help settle coughing    You can take Tylenol  as needed for fever reduction and pain relief.   For cough: honey 1/2 to 1 teaspoon (you can dilute the honey in water or another fluid).  You can also use guaifenesin and dextromethorphan for cough. You can use a humidifier for chest congestion and cough.  If you don't have a humidifier, you can sit in the bathroom with the hot shower running.      For sore throat: try warm salt water gargles, cepacol lozenges, throat spray, warm tea or water with lemon/honey, popsicles or ice, or OTC cold relief medicine for throat discomfort.   For congestion: take a daily anti-histamine like Zyrtec, Claritin, and a oral decongestant, such as pseudoephedrine.  You can also use Flonase 1-2 sprays in each nostril daily.   It is important to stay hydrated: drink plenty of fluids (water, gatorade/powerade/pedialyte, juices, or teas) to keep your throat moisturized and help further relieve irritation/discomfort.    ED Prescriptions     Medication Sig Dispense Auth. Provider   predniSONE (DELTASONE) 20 MG tablet Take 2 tablets (40 mg total) by mouth daily. 10 tablet Margarette Vannatter R, NP   moxifloxacin (VIGAMOX) 0.5 % ophthalmic solution Place 1 drop into the left eye 3 (three) times  daily. 3 mL Reena Canning, NP      PDMP not reviewed this encounter.   Reena Canning, [place] 03/18/24 [phone]

## 2024-03-18 NOTE — ED Triage Notes (Signed)
 Patient presents to [facility] for evaluation of scratchy throat yesterday after being outside in the cold, but then woke up in the middle of the night with a fever.  Took a COVID test this morning, it was expired, but it was positive.  He is needing an "official" test here today.

## 2024-03-18 NOTE — Discharge Instructions (Addendum)
 Covid 19 is a virus and should steadily improve in time it can take up to 7 to 10 days before you truly start to see a turnaround however things will get better  Begin prednisone every morning with food for 5 days to help with discomfort to the chest and may also help settle coughing    You can take Tylenol  as needed for fever reduction and pain relief.   For cough: honey 1/2 to 1 teaspoon (you can dilute the honey in water or another fluid).  You can also use guaifenesin and dextromethorphan for cough. You can use a humidifier for chest congestion and cough.  If you don't have a humidifier, you can sit in the bathroom with the hot shower running.      For sore throat: try warm salt water gargles, cepacol lozenges, throat spray, warm tea or water with lemon/honey, popsicles or ice, or OTC cold relief medicine for throat discomfort.   For congestion: take a daily anti-histamine like Zyrtec, Claritin, and a oral decongestant, such as pseudoephedrine.  You can also use Flonase 1-2 sprays in each nostril daily.   It is important to stay hydrated: drink plenty of fluids (water, gatorade/powerade/pedialyte, juices, or teas) to keep your throat moisturized and help further relieve irritation/discomfort.
